# Patient Record
Sex: Female | Born: 1960
Health system: Southern US, Community
[De-identification: ages and names within clinical notes are randomized; demographics above are authoritative.]

## PROBLEM LIST (undated history)

## (undated) DIAGNOSIS — I82409 Acute embolism and thrombosis of unspecified deep veins of unspecified lower extremity: Secondary | ICD-10-CM

## (undated) DIAGNOSIS — Z7901 Long term (current) use of anticoagulants: Secondary | ICD-10-CM

## (undated) DIAGNOSIS — E119 Type 2 diabetes mellitus without complications: Secondary | ICD-10-CM

## (undated) DIAGNOSIS — I10 Essential (primary) hypertension: Secondary | ICD-10-CM

## (undated) DIAGNOSIS — Z9229 Personal history of other drug therapy: Secondary | ICD-10-CM

## (undated) DIAGNOSIS — E669 Obesity, unspecified: Secondary | ICD-10-CM

## (undated) HISTORY — DX: Type 2 diabetes mellitus without complications: E11.9

## (undated) HISTORY — PX: TONSILLECTOMY: SHX5217

---

## 2006-05-15 ENCOUNTER — Emergency Department (HOSPITAL_COMMUNITY): Admission: EM | Admit: 2006-05-15 | Discharge: 2006-05-15 | Payer: Self-pay | Admitting: Emergency Medicine

## 2006-07-08 ENCOUNTER — Other Ambulatory Visit: Admission: RE | Admit: 2006-07-08 | Discharge: 2006-07-08 | Payer: Self-pay | Admitting: Obstetrics and Gynecology

## 2008-10-06 HISTORY — PX: ABDOMINAL HYSTERECTOMY: SHX81

## 2008-10-06 HISTORY — PX: TOTAL ABDOMINAL HYSTERECTOMY: SHX209

## 2009-05-26 ENCOUNTER — Inpatient Hospital Stay (HOSPITAL_COMMUNITY): Admission: EM | Admit: 2009-05-26 | Discharge: 2009-05-30 | Payer: Self-pay | Admitting: Emergency Medicine

## 2009-05-28 ENCOUNTER — Encounter (INDEPENDENT_AMBULATORY_CARE_PROVIDER_SITE_OTHER): Payer: Self-pay | Admitting: Internal Medicine

## 2009-05-28 ENCOUNTER — Ambulatory Visit: Payer: Self-pay | Admitting: Vascular Surgery

## 2009-12-27 ENCOUNTER — Encounter: Admission: RE | Admit: 2009-12-27 | Discharge: 2009-12-27 | Payer: Self-pay | Admitting: Obstetrics and Gynecology

## 2010-01-02 ENCOUNTER — Encounter: Admission: RE | Admit: 2010-01-02 | Discharge: 2010-01-02 | Payer: Self-pay | Admitting: Obstetrics and Gynecology

## 2010-01-04 ENCOUNTER — Encounter: Admission: RE | Admit: 2010-01-04 | Discharge: 2010-01-04 | Payer: Self-pay | Admitting: Obstetrics and Gynecology

## 2010-02-26 ENCOUNTER — Encounter (INDEPENDENT_AMBULATORY_CARE_PROVIDER_SITE_OTHER): Payer: Self-pay | Admitting: Obstetrics and Gynecology

## 2010-02-26 ENCOUNTER — Inpatient Hospital Stay (HOSPITAL_COMMUNITY): Admission: RE | Admit: 2010-02-26 | Discharge: 2010-02-28 | Payer: Self-pay | Admitting: Obstetrics and Gynecology

## 2010-12-23 LAB — CBC
MCHC: 32.6 g/dL (ref 30.0–36.0)
MCHC: 32.9 g/dL (ref 30.0–36.0)
MCV: 74.6 fL — ABNORMAL LOW (ref 78.0–100.0)
MCV: 75.3 fL — ABNORMAL LOW (ref 78.0–100.0)
RBC: 4.58 MIL/uL (ref 3.87–5.11)
RDW: 18.2 % — ABNORMAL HIGH (ref 11.5–15.5)
WBC: 10.9 10*3/uL — ABNORMAL HIGH (ref 4.0–10.5)

## 2010-12-23 LAB — COMPREHENSIVE METABOLIC PANEL
AST: 19 U/L (ref 0–37)
Calcium: 9.2 mg/dL (ref 8.4–10.5)
Chloride: 106 mEq/L (ref 96–112)
GFR calc Af Amer: >60 mL/min (ref >60)
GFR calc non Af Amer: >60 mL/min (ref >60)
Sodium: 139 mEq/L (ref 135–145)
Total Protein: 7.5 g/dL (ref 6.0–8.3)

## 2010-12-25 ENCOUNTER — Emergency Department (HOSPITAL_COMMUNITY)
Admission: EM | Admit: 2010-12-25 | Discharge: 2010-12-26 | Disposition: A | Discharge status: Home / Self Care | Payer: Medicare Other | Attending: Emergency Medicine | Admitting: Emergency Medicine

## 2010-12-25 DIAGNOSIS — M545 Low back pain, unspecified: Secondary | ICD-10-CM | POA: Insufficient documentation

## 2010-12-25 DIAGNOSIS — I10 Essential (primary) hypertension: Secondary | ICD-10-CM | POA: Insufficient documentation

## 2010-12-25 DIAGNOSIS — Z86718 Personal history of other venous thrombosis and embolism: Secondary | ICD-10-CM | POA: Insufficient documentation

## 2010-12-26 LAB — URINALYSIS, ROUTINE W REFLEX MICROSCOPIC
Hgb urine dipstick: NEGATIVE
Nitrite: NEGATIVE
Protein, ur: NEGATIVE mg/dL
Specific Gravity, Urine: 1.021 (ref 1.005–1.030)
Urobilinogen, UA: 1 mg/dL (ref 0.0–1.0)

## 2011-01-11 LAB — DIFFERENTIAL
Eosinophils Relative: 5 % (ref 0–5)
Lymphocytes Relative: 18 % (ref 12–46)
Lymphocytes Relative: 22 % (ref 12–46)
Lymphs Abs: 1.7 10*3/uL (ref 0.7–4.0)
Monocytes Absolute: 0.3 10*3/uL (ref 0.1–1.0)
Monocytes Relative: 3 % (ref 3–12)
Neutro Abs: 5.3 10*3/uL (ref 1.7–7.7)
Neutro Abs: 7.1 10*3/uL (ref 1.7–7.7)
Neutrophils Relative %: 66 % (ref 43–77)
Neutrophils Relative %: 73 % (ref 43–77)

## 2011-01-11 LAB — CBC
HCT: 28.5 % — ABNORMAL LOW (ref 36.0–46.0)
HCT: 28.8 % — ABNORMAL LOW (ref 36.0–46.0)
MCHC: 31 g/dL (ref 30.0–36.0)
MCV: 64.6 fL — ABNORMAL LOW (ref 78.0–100.0)
MCV: 64.8 fL — ABNORMAL LOW (ref 78.0–100.0)
MCV: 64.8 fL — ABNORMAL LOW (ref 78.0–100.0)
Platelets: 285 10*3/uL (ref 150–400)
Platelets: 337 10*3/uL (ref 150–400)
Platelets: 341 10*3/uL (ref 150–400)
RBC: 4.59 MIL/uL (ref 3.87–5.11)
RDW: 21.1 % — ABNORMAL HIGH (ref 11.5–15.5)
WBC: 8 10*3/uL (ref 4.0–10.5)
WBC: 8.3 10*3/uL (ref 4.0–10.5)
WBC: 9.7 10*3/uL (ref 4.0–10.5)

## 2011-01-11 LAB — HEMOGLOBIN A1C
Hgb A1c MFr Bld: 6 % (ref 4.6–6.1)
Mean Plasma Glucose: 126 mg/dL

## 2011-01-11 LAB — PROTEIN C, TOTAL: Protein C, Total: 44 % — ABNORMAL LOW (ref 70–140)

## 2011-01-11 LAB — POCT I-STAT, CHEM 8
Chloride: 109 mEq/L (ref 96–112)
Potassium: 3.3 mEq/L — ABNORMAL LOW (ref 3.5–5.1)
TCO2: 20 mmol/L (ref 0–100)

## 2011-01-11 LAB — LIPID PANEL
LDL Cholesterol: 127 mg/dL — ABNORMAL HIGH (ref 0–99)
VLDL: 15 mg/dL (ref 0–40)

## 2011-01-11 LAB — PROTIME-INR
INR: 1.2 (ref 0.00–1.49)
INR: 1.2 (ref 0.00–1.49)
Prothrombin Time: 14.9 seconds (ref 11.6–15.2)
Prothrombin Time: 15.2 seconds (ref 11.6–15.2)
Prothrombin Time: 15.5 seconds — ABNORMAL HIGH (ref 11.6–15.2)

## 2011-01-11 LAB — CARDIOLIPIN ANTIBODIES, IGG, IGM, IGA: Anticardiolipin IgA: <10 not reported — ABNORMAL LOW (ref >13)

## 2011-01-11 LAB — FACTOR 5 LEIDEN

## 2011-01-11 LAB — HEMOGLOBIN AND HEMATOCRIT, BLOOD
Hemoglobin: 8.9 g/dL — ABNORMAL LOW (ref 12.0–15.0)
Hemoglobin: 8.9 g/dL — ABNORMAL LOW (ref 12.0–15.0)

## 2011-01-11 LAB — HOMOCYSTEINE
Homocysteine: 11.3 umol/L (ref 4.0–15.4)
Homocysteine: 8.7 umol/L (ref 4.0–15.4)

## 2011-01-11 LAB — PROTEIN C ACTIVITY: Protein C Activity: 61 % — ABNORMAL LOW (ref 75–133)

## 2011-01-11 LAB — HEPATIC FUNCTION PANEL
Alkaline Phosphatase: 74 U/L (ref 39–117)
Total Bilirubin: 0.5 mg/dL (ref 0.3–1.2)

## 2011-01-11 LAB — IRON AND TIBC
Iron: 19 ug/dL — ABNORMAL LOW (ref 42–135)
Saturation Ratios: 5 % — ABNORMAL LOW (ref 20–55)
UIBC: 335 ug/dL

## 2011-01-11 LAB — POCT CARDIAC MARKERS
CKMB, poc: <1 ng/mL — ABNORMAL LOW (ref 1.0–8.0)
Myoglobin, poc: 78.4 ng/mL (ref 12–200)
Myoglobin, poc: 80.2 ng/mL (ref 12–200)

## 2011-01-11 LAB — VITAMIN B12: Vitamin B-12: 636 pg/mL (ref 211–911)

## 2011-01-11 LAB — FERRITIN: Ferritin: 12 ng/mL (ref 10–291)

## 2011-01-11 LAB — BASIC METABOLIC PANEL
BUN: 7 mg/dL (ref 6–23)
Chloride: 107 mEq/L (ref 96–112)
Creatinine, Ser: 0.88 mg/dL (ref 0.4–1.2)

## 2011-01-11 LAB — PROTEIN S ACTIVITY: Protein S Activity: 34 % — ABNORMAL LOW (ref 69–129)

## 2011-01-11 LAB — BRAIN NATRIURETIC PEPTIDE: Pro B Natriuretic peptide (BNP): 41 pg/mL (ref 0.0–100.0)

## 2011-01-11 LAB — LUPUS ANTICOAGULANT PANEL: Lupus Anticoagulant: DETECTED — AB

## 2011-01-11 LAB — PROTEIN S, TOTAL: Protein S Ag, Total: 78 % (ref 70–140)

## 2011-01-11 LAB — TSH: TSH: 2.173 u[IU]/mL (ref 0.350–4.500)

## 2011-01-20 ENCOUNTER — Other Ambulatory Visit: Payer: Self-pay | Admitting: Obstetrics and Gynecology

## 2011-01-20 DIAGNOSIS — Z1231 Encounter for screening mammogram for malignant neoplasm of breast: Secondary | ICD-10-CM

## 2011-01-24 ENCOUNTER — Ambulatory Visit
Admission: RE | Admit: 2011-01-24 | Discharge: 2011-01-24 | Disposition: A | Discharge status: Home / Self Care | Payer: Medicare Other | Source: Ambulatory Visit | Attending: Obstetrics and Gynecology | Admitting: Obstetrics and Gynecology

## 2011-01-24 DIAGNOSIS — Z1231 Encounter for screening mammogram for malignant neoplasm of breast: Secondary | ICD-10-CM

## 2011-02-18 NOTE — Discharge Summary (Signed)
[name], [name] NO.:  [id number]   MEDICAL RECORD NO.:  [id number]          PATIENT TYPE:  INP   LOCATION:  5017                         FACILITY:  [facility]   PHYSICIAN:  Laura Campbell, MDDATE OF BIRTH:  [birth year]   DATE OF ADMISSION:  05/26/2009  DATE OF DISCHARGE:  05/30/2009                               DISCHARGE SUMMARY   COURSE IN THE HOSPITAL:  A 50 year old female with history of  hypertension, noncompliant with medication presented with complains of  left lower extremity pain and shortness of breath.  On admission,  patient had a CT angio of the chest which showed bilateral pulmonary  embolism.  Subsequent Dopplers done of the lower extremity showed acute  DVT.  Patient was started on Lovenox and Coumadin.  Hypercoagulable  studies showed patient is positive for lupus anticoagulant.  At this  time, we feel patient needs to be on Coumadin lifelong.  Patient's  condition gradually improved.  At this time, patient is ambulating  without any difficulty.  Patient's INR is 2.2, will need at least 2 more  days of Lovenox after Coumadin to be continued.   Patient also had a sonogram of the pelvis which showed right adnexal  complex cyst for which patient already has a known OB/GYN, Dr.  Senaida Ores.  Patient is well aware to follow with Dr. Senaida Ores for  this complex cyst which patient strongly agrees to so patient has a  contact number of [phone].  I also called Dr. Berenda Morale office  who will be calling [name] for followup appointment.  At the time  of this dictation, patient is hemodynamically stable.   PERTINENT LABS:  Patient's lupus anticoagulant is positive.  Patient's  hemoglobin is 8.9 with a hematocrit of 28.5 which is largely stable.  Patient's INR at discharge was 2.2, creatinine was 0.8, last measured.  Percent iron saturation of 5%.   RADIOLOGICAL PROCEDURES DONE:  1. CT angio of the chest on May 26, 2009, showed  segmental and      subsegmental pulmonary emboli identified to the right middle lobe,      right lower lobe, left upper lobe and lower lobe, possible      subsegmental emboli to the right upper lobe, mild left basilar      atelectasis, mild cardiomegaly, prominent right node, mildly      prominent hilar nodes seen.  2. Ultrasound, pelvis, on May 26, 2009, showed a complex 7-cm      cystic adnexal mass.  Followup recommended to exclude neoplasm,      uterine leiomyomata.  3. Transvaginal ultrasound on May 26, 2009.  4. Ankle x-ray of the left on May 28, 2009, showed no acute      findings, mild degenerative changes.   MEDICATIONS AT DISCHARGE:  1. Lovenox 110 mg subcutaneous every 12 hours, 7 a.m. and 7 p.m.  2. Coumadin 10 mg p.o. daily, to recheck PT/INR on June 01, 2009.      At that time, if still therapeutic can discontinue Lovenox.  3. Hydrochlorothiazide 25 mg p.o. daily.  4. Lisinopril 40 mg p.o. daily.  5. Ferrous sulfate 325 mg p.o. t.i.d.   PLAN:  1. Patient is to follow with Dr. Lovell Sheehan.  Patient has been provided      the phone number, [phone], on June 01, 2009, at 12:30 p.m.      and at that time PT/INR has to be checked and further dose of      Coumadin decided at that time.  If still therapeutic, Lovenox will      be discontinued.  2. Patient has iron-deficiency anemia.  Patient was advised to check a      CBC while following with Dr. Lovell Sheehan.  Iron supplements have been      started.  3. To follow with Dr. Senaida Ores, OB/GYN, for her right adnexal mass.      Patient agrees to do so.  4. To have a 2D echo in 6 months to reassess pulmonary pressure and to      have a repeat CT chest in 3 months to assess for lymphadenopathy.   DIET:  Patient is to be on a cardiac healthy diet.  Patient probably  needs Coumadin for life long as patient was positive lupus  anticoagulant.      Laura Clos, MD  Electronically Signed     ANK/MEDQ  D:   05/30/2009  T:  05/30/2009  Job:  [id number]   cc:   Della Goo, M.D.  Dr. Senaida Ores
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
## 2011-03-05 ENCOUNTER — Emergency Department (HOSPITAL_COMMUNITY)
Admission: EM | Admit: 2011-03-05 | Discharge: 2011-03-05 | Disposition: A | Discharge status: Home / Self Care | Payer: Medicare Other | Attending: Emergency Medicine | Admitting: Emergency Medicine

## 2011-03-05 DIAGNOSIS — M533 Sacrococcygeal disorders, not elsewhere classified: Secondary | ICD-10-CM | POA: Insufficient documentation

## 2011-03-05 DIAGNOSIS — Z86718 Personal history of other venous thrombosis and embolism: Secondary | ICD-10-CM | POA: Insufficient documentation

## 2011-03-05 DIAGNOSIS — I10 Essential (primary) hypertension: Secondary | ICD-10-CM | POA: Insufficient documentation

## 2011-03-05 DIAGNOSIS — M25559 Pain in unspecified hip: Secondary | ICD-10-CM | POA: Insufficient documentation

## 2011-03-05 DIAGNOSIS — M5430 Sciatica, unspecified side: Secondary | ICD-10-CM | POA: Insufficient documentation

## 2011-03-05 DIAGNOSIS — R109 Unspecified abdominal pain: Secondary | ICD-10-CM | POA: Insufficient documentation

## 2011-03-05 LAB — URINALYSIS, ROUTINE W REFLEX MICROSCOPIC
Glucose, UA: NEGATIVE mg/dL
Hgb urine dipstick: NEGATIVE
Ketones, ur: NEGATIVE mg/dL
Protein, ur: NEGATIVE mg/dL
pH: 5.5 (ref 5.0–8.0)

## 2011-08-07 ENCOUNTER — Emergency Department (HOSPITAL_COMMUNITY)
Admission: EM | Admit: 2011-08-07 | Discharge: 2011-08-07 | Disposition: A | Discharge status: Home / Self Care | Payer: Medicare Other | Attending: Emergency Medicine | Admitting: Emergency Medicine

## 2011-08-07 ENCOUNTER — Emergency Department (HOSPITAL_COMMUNITY): Payer: Medicare Other

## 2011-08-07 DIAGNOSIS — R05 Cough: Secondary | ICD-10-CM | POA: Insufficient documentation

## 2011-08-07 DIAGNOSIS — R059 Cough, unspecified: Secondary | ICD-10-CM | POA: Insufficient documentation

## 2011-08-07 DIAGNOSIS — I10 Essential (primary) hypertension: Secondary | ICD-10-CM | POA: Insufficient documentation

## 2011-08-07 DIAGNOSIS — J40 Bronchitis, not specified as acute or chronic: Secondary | ICD-10-CM | POA: Insufficient documentation

## 2011-08-07 DIAGNOSIS — R6883 Chills (without fever): Secondary | ICD-10-CM | POA: Insufficient documentation

## 2011-08-07 DIAGNOSIS — Z86718 Personal history of other venous thrombosis and embolism: Secondary | ICD-10-CM | POA: Insufficient documentation

## 2011-08-07 DIAGNOSIS — E669 Obesity, unspecified: Secondary | ICD-10-CM | POA: Insufficient documentation

## 2011-08-07 DIAGNOSIS — R0789 Other chest pain: Secondary | ICD-10-CM | POA: Insufficient documentation

## 2012-09-11 ENCOUNTER — Encounter (HOSPITAL_COMMUNITY): Payer: Self-pay | Admitting: *Deleted

## 2012-09-11 ENCOUNTER — Emergency Department (HOSPITAL_COMMUNITY)
Admission: EM | Admit: 2012-09-11 | Discharge: 2012-09-11 | Disposition: A | Discharge status: Home / Self Care | Payer: Medicare Other | Attending: Emergency Medicine | Admitting: Emergency Medicine

## 2012-09-11 DIAGNOSIS — M549 Dorsalgia, unspecified: Secondary | ICD-10-CM | POA: Insufficient documentation

## 2012-09-11 DIAGNOSIS — G8929 Other chronic pain: Secondary | ICD-10-CM

## 2012-09-11 MED ORDER — PREDNISONE 20 MG PO TABS
60.0000 mg | ORAL_TABLET | Freq: Once | ORAL | Status: AC
Start: 2012-09-11 — End: 2012-09-11
  Administered 2012-09-11: 60 mg via ORAL
  Filled 2012-09-11: qty 3

## 2012-09-11 MED ORDER — PREDNISONE 20 MG PO TABS: ORAL_TABLET | ORAL | Status: DC

## 2012-09-11 MED ORDER — CYCLOBENZAPRINE HCL 5 MG PO TABS: 5.0000 mg | ORAL_TABLET | Freq: Three times a day (TID) | ORAL | Status: DC | PRN

## 2012-09-11 NOTE — ED Notes (Signed)
Patient is alert and orientedx4.   Patient was explained discharge instructions and she has no questions.
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
## 2012-09-11 NOTE — ED Notes (Signed)
The pt has had lower back pain for 2 weeks this time.  She has chronic back pain  For a long time.  No new injury.  She has pain from her neck down to her lower back

## 2012-09-11 NOTE — ED Provider Notes (Signed)
History     CSN: [id number]  Arrival date & time 09/11/12  4696   First MD Initiated Contact with Patient 09/11/12 2103      Chief Complaint  Patient presents with  . Back Pain    (Consider location/radiation/quality/duration/timing/severity/associated sxs/prior treatment) HPI Comments: Patient with Hx chronic back pain refusing surgery has been taking Percocet for 2 weeks without relief nor FU with PCP now pain is radiation to R buttock   Patient is a 51 y.o. female presenting with back pain. The history is provided by the patient.  Back Pain  This is a chronic problem. The current episode started more than 1 week ago. The problem occurs constantly. The problem has been gradually worsening. Pertinent negatives include no fever and no weakness.    History reviewed. No pertinent past medical history.  History reviewed. No pertinent past surgical history.  History reviewed. No pertinent family history.  History  Substance Use Topics  . Smoking status: Never Smoker   . Smokeless tobacco: Not on file  . Alcohol Use: No    OB History    Grav Para Term Preterm Abortions TAB SAB Ect Mult Living                  Review of Systems  Constitutional: Negative for fever and chills.  HENT: Negative for neck pain.   Respiratory: Negative for cough.   Cardiovascular: Negative for leg swelling.  Genitourinary: Negative.   Musculoskeletal: Positive for back pain. Negative for joint swelling and gait problem.  Neurological: Negative for dizziness and weakness.    Allergies  Review of patient's allergies indicates no known allergies.  Home Medications   Current Outpatient Rx  Name  Route  Sig  Dispense  Refill  . HYDROCHLOROTHIAZIDE 25 MG PO TABS   Oral   Take 25 mg by mouth daily.         Marland Kitchen HYDROCODONE-ACETAMINOPHEN 5-500 MG PO TABS   Oral   Take 1 tablet by mouth 2 (two) times daily as needed. For pain         . LOSARTAN POTASSIUM 100 MG PO TABS   Oral   Take 100  mg by mouth daily.         Marland Kitchen POTASSIUM CHLORIDE CRYS ER 10 MEQ PO TBCR   Oral   Take 10 mEq by mouth daily.         . CYCLOBENZAPRINE HCL 5 MG PO TABS   Oral   Take 1 tablet (5 mg total) by mouth 3 (three) times daily as needed for muscle spasms.   30 tablet   0   . PREDNISONE 20 MG PO TABS      3 Tabs PO Days 1-3, then 2 tabs PO Days 4-6, then 1 tab PO Day 7-9, then Half Tab PO Day 10-12   20 tablet   0     BP 144/76  Pulse 58  Temp 97.8 F (36.6 C) (Oral)  Resp 20  SpO2 96%  Physical Exam  Constitutional: She is oriented to person, place, and time. She appears well-developed and well-nourished.  HENT:  Head: Normocephalic.  Eyes: Pupils are equal, round, and reactive to light.  Neck: Normal range of motion.  Cardiovascular: Normal rate.   Pulmonary/Chest: Effort normal.  Abdominal: Soft.  Musculoskeletal: Normal range of motion.  Neurological: She is alert and oriented to person, place, and time.  Skin: Skin is warm.    ED Course  Procedures (including critical care time)  Labs Reviewed - No data to display No results found.   1. Chronic back pain       MDM  Chronic back pain excerebration.           Arman Filter, NP 09/11/12 2154

## 2012-09-12 NOTE — ED Provider Notes (Signed)
Medical screening examination/treatment/procedure(s) were performed by non-physician practitioner and as supervising physician I was immediately available for consultation/collaboration.  Nihal Marzella, MD 09/12/12 1731 

## 2012-12-31 ENCOUNTER — Encounter (HOSPITAL_COMMUNITY): Payer: Self-pay

## 2012-12-31 ENCOUNTER — Emergency Department (HOSPITAL_COMMUNITY)
Admission: EM | Admit: 2012-12-31 | Discharge: 2012-12-31 | Disposition: A | Discharge status: Home / Self Care | Payer: Medicare Other | Attending: Emergency Medicine | Admitting: Emergency Medicine

## 2012-12-31 DIAGNOSIS — G8929 Other chronic pain: Secondary | ICD-10-CM

## 2012-12-31 DIAGNOSIS — M542 Cervicalgia: Secondary | ICD-10-CM | POA: Insufficient documentation

## 2012-12-31 DIAGNOSIS — R269 Unspecified abnormalities of gait and mobility: Secondary | ICD-10-CM | POA: Insufficient documentation

## 2012-12-31 DIAGNOSIS — Z8679 Personal history of other diseases of the circulatory system: Secondary | ICD-10-CM | POA: Insufficient documentation

## 2012-12-31 DIAGNOSIS — M545 Low back pain, unspecified: Secondary | ICD-10-CM

## 2012-12-31 DIAGNOSIS — Z79899 Other long term (current) drug therapy: Secondary | ICD-10-CM | POA: Insufficient documentation

## 2012-12-31 DIAGNOSIS — M5430 Sciatica, unspecified side: Secondary | ICD-10-CM

## 2012-12-31 DIAGNOSIS — M7989 Other specified soft tissue disorders: Secondary | ICD-10-CM | POA: Insufficient documentation

## 2012-12-31 DIAGNOSIS — I10 Essential (primary) hypertension: Secondary | ICD-10-CM | POA: Insufficient documentation

## 2012-12-31 HISTORY — DX: Essential (primary) hypertension: I10

## 2012-12-31 LAB — URINALYSIS, ROUTINE W REFLEX MICROSCOPIC
Bilirubin Urine: NEGATIVE
Glucose, UA: NEGATIVE mg/dL
Ketones, ur: NEGATIVE mg/dL
Leukocytes, UA: NEGATIVE
Specific Gravity, Urine: 1.022 (ref 1.005–1.030)
pH: 5 (ref 5.0–8.0)

## 2012-12-31 LAB — POCT I-STAT, CHEM 8
Chloride: 106 mEq/L (ref 96–112)
Glucose, Bld: 117 mg/dL — ABNORMAL HIGH (ref 70–99)
HCT: 41 % (ref 36.0–46.0)
Potassium: 3.9 mEq/L (ref 3.5–5.1)
Sodium: 142 mEq/L (ref 135–145)

## 2012-12-31 MED ORDER — HYDROCODONE-ACETAMINOPHEN 5-325 MG PO TABS: 1.0000 | ORAL_TABLET | Freq: Four times a day (QID) | ORAL | Status: DC | PRN

## 2012-12-31 MED ORDER — MELOXICAM 15 MG PO TABS: 15.0000 mg | ORAL_TABLET | Freq: Every day | ORAL | Status: DC

## 2012-12-31 MED ORDER — DEXAMETHASONE SODIUM PHOSPHATE 10 MG/ML IJ SOLN
10.0000 mg | Freq: Once | INTRAMUSCULAR | Status: AC
  Administered 2012-12-31: 10 mg via INTRAMUSCULAR
  Filled 2012-12-31: qty 1

## 2012-12-31 NOTE — ED Notes (Signed)
Pt dc'd home w/2 rx, drove self home, no narcotics given in ED, pt alert and ambulatory upon dc, verbalizes understanding of dc instructions

## 2012-12-31 NOTE — ED Provider Notes (Signed)
History     CSN: [id number]  Arrival date & time 12/31/12  0811   First MD Initiated Contact with Patient 12/31/12 [phone]      Chief Complaint  Patient presents with  . Sciatica    (Consider location/radiation/quality/duration/timing/severity/associated sxs/prior treatment) The history is provided by the patient. No language interpreter was used.     Laura Campbell is a 52 year old female with a past medical history of hypertension, cardiac disease, intermittent back pain with sciatica, previous DVT of the left leg.  Patient states that she was here 2 months ago for this same issue.  She was given oral prednisone which did not relieve her symptoms.  Patient states that she normally gets IM dexamethasone which generally helps her.  Patient states that her pain has been increasing in severity over the past 4 days and includes bilateral sciatica which is new. She normally has UL sciatica.  She has pain in her hips and generalized back pain all the way up to the neck.  He should also complains of swelling and tightness in the left leg and pain with walking.  She has a previous history of DVT in the leg but states her symptoms are different from her previous DVT. Denies weakness, loss of bowel/bladder function or saddle anesthesia. Denies neck stiffness, headache, rash.  Denies fever or recent procedures to back. Patient is able to ambulate with pain.  He has been taking Advil for pain relief which had little effect.      Past Medical History  Diagnosis Date  . Hypertension     History reviewed. No pertinent past surgical history.  History reviewed. No pertinent family history.  History  Substance Use Topics  . Smoking status: Never Smoker   . Smokeless tobacco: Not on file  . Alcohol Use: No    OB History   Grav Para Term Preterm Abortions TAB SAB Ect Mult Living                  Review of Systems  Constitutional: Positive for activity change. Negative for fever, chills  and unexpected weight change.  HENT: Positive for neck pain. Negative for trouble swallowing and neck stiffness.   Eyes: Negative.   Respiratory: Negative for chest tightness and shortness of breath.   Cardiovascular: Positive for leg swelling. Negative for chest pain and palpitations.  Gastrointestinal: Negative for nausea, vomiting, abdominal pain, diarrhea and constipation.  Genitourinary: Negative for dysuria, urgency, hematuria, flank pain, difficulty urinating and pelvic pain.  Musculoskeletal: Positive for back pain and gait problem. Negative for myalgias and joint swelling.  Skin: Negative for rash and wound.  Neurological: Negative for dizziness, tremors, syncope, facial asymmetry, weakness, light-headedness and numbness.  All other systems reviewed and are negative.    Allergies  Review of patient's allergies indicates no known allergies.  Home Medications   Current Outpatient Rx  Name  Route  Sig  Dispense  Refill  . hydrochlorothiazide (HYDRODIURIL) 25 MG tablet   Oral   Take 25 mg by mouth daily.         Marland Kitchen losartan (COZAAR) 100 MG tablet   Oral   Take 100 mg by mouth daily.         . potassium chloride (K-DUR,KLOR-CON) 10 MEQ tablet   Oral   Take 10 mEq by mouth daily.           BP 181/99  Pulse 64  Temp(Src) 97.7 F (36.5 C) (Oral)  Resp 20  SpO2 99%  Physical Exam  Constitutional: She is oriented to person, place, and time. She appears well-developed and well-nourished. No distress.  HENT:  Head: Normocephalic and atraumatic.  Eyes: Conjunctivae are normal. No scleral icterus.  Neck: Normal range of motion.  Cardiovascular: Normal rate, regular rhythm, normal heart sounds and intact distal pulses.  Exam reveals no gallop and no friction rub.   No murmur heard. Patient with swelling of the left calf greater than the right by approximately 3 cm.  Pulmonary/Chest: Effort normal and breath sounds normal. No respiratory distress.  Abdominal: Soft.  Bowel sounds are normal. She exhibits no distension and no mass. There is no tenderness. There is no guarding.  Musculoskeletal: She exhibits tenderness. She exhibits no edema.  Back exam: antalgic gait, limited range of motion, tenderness noted diffusely, sacroiliac joints and sciatic notches tender, positive straight-leg raise pos, normal reflexes and strength bilateral lower extremities.   Neurological: She is alert and oriented to person, place, and time.  Skin: Skin is warm and dry. She is not diaphoretic.    ED Course  Procedures (including critical care time)  Labs Reviewed  URINALYSIS, ROUTINE W REFLEX MICROSCOPIC - Abnormal; Notable for the following:    APPearance HAZY (*)    All other components within normal limits  POCT I-STAT, CHEM 8 - Abnormal; Notable for the following:    Glucose, Bld 117 (*)    Calcium, Ion 1.29 (*)    All other components within normal limits  D-DIMER, QUANTITATIVE   No results found.   1. Acute exacerbation of chronic low back pain   2. Sciatica, unspecified laterality       MDM  Patient seen for bask pain with swelling of the left calf.  Will evaluate for DVT with D-Dimer.    D-dimer negative. Will d/c with NSAIDs. Narcotic, PCP follow up. Patient with back pain.  No neurological deficits and normal neuro exam.  Patient can walk but states is painful.  No loss of bowel or bladder control.  No concern for cauda equina.  No fever, night sweats, weight loss, h/o cancer, IVDU.  RICE protocol and pain medicine indicated and discussed with patient.          Arthor Captain, PA-C 01/02/13 1330

## 2012-12-31 NOTE — ED Notes (Signed)
Pt complains of pain in right flank, back and hip area, onset was 4 days ago, sts her pain meds are just not helping.

## 2013-01-03 NOTE — ED Provider Notes (Signed)
Medical screening examination/treatment/procedure(s) were performed by non-physician practitioner and as supervising physician I was immediately available for consultation/collaboration.   Shelda Jakes, MD 01/03/13 [phone]

## 2013-02-23 ENCOUNTER — Other Ambulatory Visit: Payer: Self-pay | Admitting: Internal Medicine

## 2013-02-23 DIAGNOSIS — Z1231 Encounter for screening mammogram for malignant neoplasm of breast: Secondary | ICD-10-CM

## 2013-04-06 ENCOUNTER — Ambulatory Visit
Admission: RE | Admit: 2013-04-06 | Discharge: 2013-04-06 | Disposition: A | Discharge status: Home / Self Care | Payer: Medicare Other | Source: Ambulatory Visit | Attending: Internal Medicine | Admitting: Internal Medicine

## 2013-04-06 DIAGNOSIS — Z1231 Encounter for screening mammogram for malignant neoplasm of breast: Secondary | ICD-10-CM

## 2013-05-08 ENCOUNTER — Emergency Department (HOSPITAL_COMMUNITY)
Admission: EM | Admit: 2013-05-08 | Discharge: 2013-05-08 | Disposition: A | Discharge status: Home / Self Care | Payer: Medicare Other | Attending: Emergency Medicine | Admitting: Emergency Medicine

## 2013-05-08 ENCOUNTER — Encounter (HOSPITAL_COMMUNITY): Payer: Self-pay | Admitting: *Deleted

## 2013-05-08 DIAGNOSIS — R000 Tachycardia, unspecified: Secondary | ICD-10-CM | POA: Insufficient documentation

## 2013-05-08 DIAGNOSIS — J029 Acute pharyngitis, unspecified: Secondary | ICD-10-CM | POA: Insufficient documentation

## 2013-05-08 DIAGNOSIS — R509 Fever, unspecified: Secondary | ICD-10-CM | POA: Insufficient documentation

## 2013-05-08 DIAGNOSIS — B349 Viral infection, unspecified: Secondary | ICD-10-CM

## 2013-05-08 DIAGNOSIS — Z79899 Other long term (current) drug therapy: Secondary | ICD-10-CM | POA: Insufficient documentation

## 2013-05-08 DIAGNOSIS — IMO0001 Reserved for inherently not codable concepts without codable children: Secondary | ICD-10-CM | POA: Insufficient documentation

## 2013-05-08 DIAGNOSIS — B9789 Other viral agents as the cause of diseases classified elsewhere: Secondary | ICD-10-CM | POA: Insufficient documentation

## 2013-05-08 DIAGNOSIS — I10 Essential (primary) hypertension: Secondary | ICD-10-CM | POA: Insufficient documentation

## 2013-05-08 LAB — POCT I-STAT, CHEM 8
BUN: 12 mg/dL (ref 6–23)
Calcium, Ion: 1.19 mmol/L (ref 1.12–1.23)
Chloride: 106 mEq/L (ref 96–112)
Glucose, Bld: 110 mg/dL — ABNORMAL HIGH (ref 70–99)
HCT: 41 % (ref 36.0–46.0)

## 2013-05-08 LAB — CBC
HCT: 40.3 % (ref 36.0–46.0)
Hemoglobin: 13.4 g/dL (ref 12.0–15.0)
MCHC: 33.3 g/dL (ref 30.0–36.0)
RBC: 5.22 MIL/uL — ABNORMAL HIGH (ref 3.87–5.11)
WBC: 11.1 10*3/uL — ABNORMAL HIGH (ref 4.0–10.5)

## 2013-05-08 MED ORDER — SODIUM CHLORIDE 0.9 % IV BOLUS (SEPSIS)
1000.0000 mL | Freq: Once | INTRAVENOUS | Status: AC
  Administered 2013-05-08: 1000 mL via INTRAVENOUS

## 2013-05-08 MED ORDER — ACETAMINOPHEN 325 MG PO TABS
650.0000 mg | ORAL_TABLET | Freq: Once | ORAL | Status: AC
  Administered 2013-05-08: 650 mg via ORAL
  Filled 2013-05-08: qty 2

## 2013-05-08 NOTE — ED Notes (Signed)
Self administered ibuprofen 1800

## 2013-05-08 NOTE — ED Notes (Signed)
Family at bedside. 

## 2013-05-08 NOTE — ED Notes (Signed)
MD at bedside. 

## 2013-05-08 NOTE — ED Provider Notes (Addendum)
CSN: [id number]     Arrival date & time 05/08/13  1850 History     First MD Initiated Contact with Patient 05/08/13 1917     Chief Complaint  Patient presents with  . Shortness of Breath   (Consider location/radiation/quality/duration/timing/severity/associated sxs/prior Treatment) Patient is a 52 y.o. female presenting with fever. The history is provided by the patient and a relative.  Fever Max temp prior to arrival:  103 Temp source:  Oral Severity:  Severe Onset quality:  Gradual Duration:  1 day Timing:  Constant Progression:  Worsening Chronicity:  New Relieved by:  Nothing Worsened by:  Nothing tried Ineffective treatments:  Ibuprofen Associated symptoms: chills, myalgias and sore throat   Associated symptoms: no chest pain, no confusion, no cough, no dysuria, no ear pain, no headaches, no nausea and no vomiting   Associated symptoms comment:  Generalized malaise and weakness Risk factors: sick contacts   Risk factors: no hx of cancer, no immunosuppression and no recent surgery   Risk factors comment:  Recently around her grandchildren who were sick with URI sx.   Past Medical History  Diagnosis Date  . Hypertension    No past surgical history on file. No family history on file. History  Substance Use Topics  . Smoking status: Never Smoker   . Smokeless tobacco: Not on file  . Alcohol Use: No   OB History   Grav Para Term Preterm Abortions TAB SAB Ect Mult Living                 Review of Systems  Constitutional: Positive for fever and chills.  HENT: Positive for sore throat. Negative for ear pain.   Respiratory: Negative for cough.   Cardiovascular: Negative for chest pain.  Gastrointestinal: Negative for nausea and vomiting.  Genitourinary: Negative for dysuria.  Musculoskeletal: Positive for myalgias.  Neurological: Negative for headaches.  Psychiatric/Behavioral: Negative for confusion.  All other systems reviewed and are negative.    Allergies   Review of patient's allergies indicates no known allergies.  Home Medications   Current Outpatient Rx  Name  Route  Sig  Dispense  Refill  . hydrochlorothiazide (HYDRODIURIL) 25 MG tablet   Oral   Take 25 mg by mouth daily.         Marland Kitchen HYDROcodone-acetaminophen (NORCO) 5-325 MG per tablet   Oral   Take 1-2 tablets by mouth every 6 (six) hours as needed for pain.   20 tablet   0   . losartan (COZAAR) 100 MG tablet   Oral   Take 100 mg by mouth daily.         . meloxicam (MOBIC) 15 MG tablet   Oral   Take 1 tablet (15 mg total) by mouth daily. Take 1 daily with food.   10 tablet   0   . potassium chloride (K-DUR,KLOR-CON) 10 MEQ tablet   Oral   Take 10 mEq by mouth daily.          BP 172/65  Pulse 106  Temp(Src) 103 F (39.4 C) (Oral)  Resp 20  SpO2 98% Physical Exam  Nursing note and vitals reviewed. Constitutional: She is oriented to person, place, and time. She appears well-developed and well-nourished. No distress.  HENT:  Head: Normocephalic and atraumatic. No trismus in the jaw.  Right Ear: Tympanic membrane and ear canal normal.  Left Ear: Tympanic membrane and ear canal normal.  Mouth/Throat: No edematous. Oropharyngeal exudate and posterior oropharyngeal erythema present. No tonsillar abscesses.  Eyes: Conjunctivae and EOM are normal. Pupils are equal, round, and reactive to light.  Neck: Normal range of motion. Neck supple.  Cardiovascular: Regular rhythm and intact distal pulses.  Tachycardia present.   No murmur heard. Pulmonary/Chest: Effort normal and breath sounds normal. No respiratory distress. She has no wheezes. She has no rales.  Abdominal: Soft. She exhibits no distension. There is no tenderness. There is no rebound and no guarding.  Musculoskeletal: Normal range of motion. She exhibits no edema and no tenderness.  Lymphadenopathy:    She has cervical adenopathy.  Neurological: She is alert and oriented to person, place, and time.  Skin:  Skin is warm and dry. No rash noted. No erythema.  Psychiatric: She has a normal mood and affect. Her behavior is normal.    ED Course   Procedures (including critical care time)  Labs Reviewed  CBC - Abnormal; Notable for the following:    WBC 11.1 (*)    RBC 5.22 (*)    MCV 77.2 (*)    MCH 25.7 (*)    All other components within normal limits  POCT I-STAT, CHEM 8 - Abnormal; Notable for the following:    Glucose, Bld 110 (*)    All other components within normal limits  RAPID STREP SCREEN  CULTURE, GROUP A STREP   No results found. 1. Viral syndrome     MDM   Pt with symptoms consistent with viral syndrome vs strep throat.  Patchy lesions on bilateral tonsils.  Febrile to 103 here despite ibuprofen 1 hour ago.  No signs of breathing difficulty but states mild SOB today but states that has been constant since blood clots in 2010.  No signs of otitis or abnormal abdominal findings.   Rapid strep, CBC, i-stat and CXR pending.  Pt given tylenol and fluids.  9:16 PM Labs wnl and strep neg.  Pt feeling much better after fluids and fever control.  Gwyneth Sprout, MD 05/08/13 2117  Gwyneth Sprout, MD 05/08/13 2119

## 2013-05-08 NOTE — ED Notes (Signed)
Pt c/o Fever (103.0), back of her legs hurts, and SOB.

## 2013-05-10 LAB — CULTURE, GROUP A STREP

## 2013-12-21 ENCOUNTER — Other Ambulatory Visit: Payer: Self-pay | Admitting: Physical Medicine and Rehabilitation

## 2013-12-21 DIAGNOSIS — M79609 Pain in unspecified limb: Secondary | ICD-10-CM

## 2013-12-21 DIAGNOSIS — G894 Chronic pain syndrome: Secondary | ICD-10-CM

## 2013-12-21 DIAGNOSIS — M545 Low back pain, unspecified: Secondary | ICD-10-CM

## 2013-12-30 ENCOUNTER — Ambulatory Visit
Admission: RE | Admit: 2013-12-30 | Discharge: 2013-12-30 | Disposition: A | Discharge status: Home / Self Care | Payer: Medicare Other | Source: Ambulatory Visit | Attending: Physical Medicine and Rehabilitation | Admitting: Physical Medicine and Rehabilitation

## 2013-12-30 DIAGNOSIS — M545 Low back pain, unspecified: Secondary | ICD-10-CM

## 2013-12-30 DIAGNOSIS — M79609 Pain in unspecified limb: Secondary | ICD-10-CM

## 2013-12-30 DIAGNOSIS — G894 Chronic pain syndrome: Secondary | ICD-10-CM

## 2014-03-22 ENCOUNTER — Other Ambulatory Visit (HOSPITAL_COMMUNITY): Payer: Self-pay | Admitting: Family Medicine

## 2014-03-22 DIAGNOSIS — M549 Dorsalgia, unspecified: Secondary | ICD-10-CM

## 2014-04-03 ENCOUNTER — Encounter (HOSPITAL_COMMUNITY): Payer: Medicare Other

## 2014-04-03 ENCOUNTER — Encounter (HOSPITAL_COMMUNITY)
Admission: RE | Admit: 2014-04-03 | Discharge: 2014-04-03 | Disposition: A | Discharge status: Home / Self Care | Payer: Medicare Other | Source: Ambulatory Visit | Attending: Family Medicine | Admitting: Family Medicine

## 2014-04-03 DIAGNOSIS — M549 Dorsalgia, unspecified: Secondary | ICD-10-CM | POA: Insufficient documentation

## 2014-04-03 MED ORDER — TECHNETIUM TC 99M MEDRONATE IV KIT
25.0000 | PACK | Freq: Once | INTRAVENOUS | Status: AC | PRN
  Administered 2014-04-03: 25 via INTRAVENOUS

## 2014-04-06 ENCOUNTER — Other Ambulatory Visit: Payer: Self-pay | Admitting: Family Medicine

## 2014-04-06 DIAGNOSIS — N2889 Other specified disorders of kidney and ureter: Secondary | ICD-10-CM

## 2014-04-06 LAB — HM COLONOSCOPY

## 2014-04-11 ENCOUNTER — Ambulatory Visit
Admission: RE | Admit: 2014-04-11 | Discharge: 2014-04-11 | Disposition: A | Discharge status: Home / Self Care | Payer: Medicare Other | Source: Ambulatory Visit | Attending: Family Medicine | Admitting: Family Medicine

## 2014-04-11 DIAGNOSIS — N2889 Other specified disorders of kidney and ureter: Secondary | ICD-10-CM

## 2014-04-11 MED ORDER — IOHEXOL 350 MG/ML SOLN
100.0000 mL | Freq: Once | INTRAVENOUS | Status: AC | PRN
  Administered 2014-04-11: 100 mL via INTRAVENOUS

## 2014-06-06 ENCOUNTER — Encounter: Payer: Self-pay | Admitting: *Deleted

## 2014-06-06 ENCOUNTER — Encounter: Payer: Medicare Other | Attending: Family Medicine | Admitting: *Deleted

## 2014-06-06 VITALS — Ht 61.5 in | Wt 241.4 lb

## 2014-06-06 DIAGNOSIS — E119 Type 2 diabetes mellitus without complications: Secondary | ICD-10-CM | POA: Diagnosis not present

## 2014-06-06 DIAGNOSIS — Z713 Dietary counseling and surveillance: Secondary | ICD-10-CM | POA: Diagnosis not present

## 2014-06-06 NOTE — Progress Notes (Signed)
  Medical Nutrition Therapy:  Appt start time: 1030 end time:  1200.  Assessment:  Primary concerns today: 06/06/14. She is living with her daughter, they share the food shopping and cooking. She is home during the day. She enjoys bowling twice a week and goes to the casino in [place] or visits sister in [place] when she can. She has a meter and she checks her BG twice a week before a meal with reported range of 90-120 mg/dl. She is cleaning all the time and watches her grandchildren.  Preferred Learning Style: all of the following:  Auditory  Visual  Hands on  Learning Readiness:   Ready  Change in progress  MEDICATIONS: see list, diabetes medication is Metformin.   DIETARY INTAKE:  24-hr recall:  B (10 AM): 2 pancakes with syrup, boiled egg, 2 slices bacon, 8 oz OJ Snk ( AM): fresh fruit during the AM  L ( PM): skips usually or maybe a salad Snk : peanuts or fresh fruit D (6:30 PM): meat, starch, vegetables, 8  ozfruit juiceSnk ( PM):  Beverages: fruit juices or pink lemonade, regular soda if eating out, but not often  Usual physical activity: watching grand children and house cleaning. Used to go to gym, but having back problems. Just moved into daughter's home that has steps, so she is up and down steps all day.  Estimated energy needs: 1400 calories 158 g carbohydrates 105 g protein 39 g fat  Progress Towards Goal(s):  In progress.   Nutritional Diagnosis:  NB-1.1 Food and nutrition-related knowledge deficit As related to new diagnosis of diabets.  As evidenced by A1C of 6.7%..    Intervention:  Nutrition counseling and diabetes education initiated. Discussed Carb Counting as method of portion control, reading food labels, and benefits of increased activity.  Plan:  Aim for 3 Carb Choices per meal (45 grams) +/- 1 either way  Aim for 0-1 Carbs per snack if hungry  Include protein in moderation with your meals and snacks Consider reading food labels for Total  Carbohydrate and Fat Grams of foods Continue with your activity level by using the stairs and walking as tolerated. You might be adding water exercises in the future... Consider checking BG at alternate times per day as directed by MD  Continue taking medication of Metformin as directed by MD You're already doing a good job, congrats on 8 pound weight loss!  Teaching Method Utilized: Visual, Auditory, and Hands on  Handouts given during visit include: Living Well with Diabetes Carb Counting and Food Label handouts Meal Plan Card Food Journal sheets   Barriers to learning/adherence to lifestyle change: none  Demonstrated degree of understanding via:  Teach Back   Monitoring/Evaluation:  Dietary intake, exercise, reading food labels, and body weight prn.

## 2014-06-06 NOTE — Patient Instructions (Signed)
Plan:  Aim for 3 Carb Choices per meal (45 grams) +/- 1 either way  Aim for 0-1 Carbs per snack if hungry  Include protein in moderation with your meals and snacks Consider reading food labels for Total Carbohydrate and Fat Grams of foods Continue with your activity level by using the stairs and walking as tolerated. You might be adding water exercises in the future... Consider checking BG at alternate times per day as directed by MD  Continue taking medication of Metformin as directed by MD You're already doing a good job, congrats on 8 pound weight loss!

## 2014-07-03 ENCOUNTER — Emergency Department (HOSPITAL_COMMUNITY)
Admission: EM | Admit: 2014-07-03 | Discharge: 2014-07-03 | Disposition: A | Discharge status: Home / Self Care | Payer: Medicare Other | Attending: Emergency Medicine | Admitting: Emergency Medicine

## 2014-07-03 ENCOUNTER — Emergency Department (HOSPITAL_COMMUNITY): Payer: Medicare Other

## 2014-07-03 ENCOUNTER — Encounter (HOSPITAL_COMMUNITY): Payer: Self-pay | Admitting: Emergency Medicine

## 2014-07-03 DIAGNOSIS — E119 Type 2 diabetes mellitus without complications: Secondary | ICD-10-CM | POA: Insufficient documentation

## 2014-07-03 DIAGNOSIS — Z79899 Other long term (current) drug therapy: Secondary | ICD-10-CM | POA: Diagnosis not present

## 2014-07-03 DIAGNOSIS — I10 Essential (primary) hypertension: Secondary | ICD-10-CM | POA: Insufficient documentation

## 2014-07-03 DIAGNOSIS — M7581 Other shoulder lesions, right shoulder: Secondary | ICD-10-CM

## 2014-07-03 DIAGNOSIS — M79609 Pain in unspecified limb: Secondary | ICD-10-CM | POA: Diagnosis present

## 2014-07-03 DIAGNOSIS — M25519 Pain in unspecified shoulder: Secondary | ICD-10-CM | POA: Diagnosis not present

## 2014-07-03 MED ORDER — HYDROCODONE-ACETAMINOPHEN 5-325 MG PO TABS
2.0000 | ORAL_TABLET | ORAL | Status: DC | PRN
Start: 2014-07-03 — End: 2015-03-22

## 2014-07-03 MED ORDER — IBUPROFEN 800 MG PO TABS: 800.0000 mg | ORAL_TABLET | Freq: Three times a day (TID) | ORAL | Status: DC

## 2014-07-03 NOTE — ED Notes (Signed)
Rt arm pain and shoulder pain after bowling last month hurts to move has 2 young children w/ her

## 2014-07-03 NOTE — Discharge Instructions (Signed)

## 2014-07-03 NOTE — ED Provider Notes (Signed)
Medical screening examination/treatment/procedure(s) were performed by non-physician practitioner and as supervising physician I was immediately available for consultation/collaboration.   EKG Interpretation None        Evelina Bucy, MD 07/03/14 [phone]

## 2014-07-03 NOTE — ED Provider Notes (Signed)
CSN: [id number]     Arrival date & time 07/03/14  1139 History   First MD Initiated Contact with Patient 07/03/14 1213     Chief Complaint  Patient presents with  . Arm Pain     (Consider location/radiation/quality/duration/timing/severity/associated sxs/prior Treatment) Patient is a 53 y.o. female presenting with arm pain. The history is provided by the patient. No language interpreter was used.  Arm Pain This is a new problem. The current episode started more than 1 month ago. The problem occurs constantly. The problem has been gradually worsening. Associated symptoms include arthralgias, joint swelling and myalgias. Nothing aggravates the symptoms. She has tried nothing for the symptoms. The treatment provided moderate relief.  Pt injured shoulder while bowling a month ago  Past Medical History  Diagnosis Date  . Hypertension   . Diabetes mellitus without complication    Past Surgical History  Procedure Laterality Date  . Abdominal hysterectomy     No family history on file. History  Substance Use Topics  . Smoking status: Former Smoker    Quit date: 06/06/1994  . Smokeless tobacco: Not on file  . Alcohol Use: No   OB History   Grav Para Term Preterm Abortions TAB SAB Ect Mult Living                 Review of Systems  Musculoskeletal: Positive for arthralgias, joint swelling and myalgias.  All other systems reviewed and are negative.     Allergies  Review of patient's allergies indicates no known allergies.  Home Medications   Prior to Admission medications   Medication Sig Start Date End Date Taking? Authorizing Provider  hydrochlorothiazide (HYDRODIURIL) 25 MG tablet Take 25 mg by mouth daily.   Yes Historical Provider, MD  losartan (COZAAR) 100 MG tablet Take 100 mg by mouth daily.   Yes Historical Provider, MD  metFORMIN (GLUCOPHAGE) 500 MG tablet Take 500 mg by mouth daily.   Yes Historical Provider, MD  potassium chloride (K-DUR,KLOR-CON) 10 MEQ tablet  Take 10 mEq by mouth daily.   Yes Historical Provider, MD   BP 140/81  Pulse 70  Temp(Src) 98.6 F (37 C) (Oral)  SpO2 97% Physical Exam  Nursing note and vitals reviewed. Constitutional: She is oriented to person, place, and time. She appears well-developed and well-nourished.  Musculoskeletal: She exhibits tenderness.  Tender right shoulder, decreased range of motion,  nv and ns intact  Neurological: She is alert and oriented to person, place, and time. She has normal reflexes.  Skin: Skin is warm.  Psychiatric: She has a normal mood and affect.    ED Course  Procedures (including critical care time) Labs Review Labs Reviewed - No data to display  Imaging Review Dg Shoulder Right  07/03/2014   CLINICAL DATA:  Chronic right shoulder pain.  EXAM: RIGHT SHOULDER - 2+ VIEW  COMPARISON:  None.  FINDINGS: There is no acute bony or joint abnormality. Acromioclavicular osteoarthritis is seen. Marked appearing acromioclavicular degenerative change is noted. Spurring at the greater tuberosity is suggestive of rotator cuff tendinopathy. Image right lung and ribs are unremarkable.  IMPRESSION: No acute finding.  Acromioclavicular osteoarthritis and findings suggestive of rotator cuff tendinosis.   Electronically Signed   By: Inge Rise M.D.   On: 07/03/2014 13:27     EKG Interpretation None      MDM  I suspect rotator cuff tear vs tendonitis   Final diagnoses:  Right rotator cuff tendonitis    Ibuprofen hydrocodone  [place], PA-C 07/03/14 1339

## 2015-03-18 ENCOUNTER — Encounter (HOSPITAL_COMMUNITY): Payer: Self-pay | Admitting: *Deleted

## 2015-03-18 ENCOUNTER — Emergency Department (HOSPITAL_COMMUNITY)
Admission: EM | Admit: 2015-03-18 | Discharge: 2015-03-18 | Disposition: A | Discharge status: Home / Self Care | Payer: Medicare HMO | Attending: Emergency Medicine | Admitting: Emergency Medicine

## 2015-03-18 DIAGNOSIS — E119 Type 2 diabetes mellitus without complications: Secondary | ICD-10-CM | POA: Insufficient documentation

## 2015-03-18 DIAGNOSIS — M79672 Pain in left foot: Secondary | ICD-10-CM

## 2015-03-18 DIAGNOSIS — I10 Essential (primary) hypertension: Secondary | ICD-10-CM | POA: Diagnosis not present

## 2015-03-18 DIAGNOSIS — Z87891 Personal history of nicotine dependence: Secondary | ICD-10-CM | POA: Diagnosis not present

## 2015-03-18 DIAGNOSIS — Z791 Long term (current) use of non-steroidal anti-inflammatories (NSAID): Secondary | ICD-10-CM | POA: Diagnosis not present

## 2015-03-18 DIAGNOSIS — Z79899 Other long term (current) drug therapy: Secondary | ICD-10-CM | POA: Diagnosis not present

## 2015-03-18 DIAGNOSIS — E669 Obesity, unspecified: Secondary | ICD-10-CM | POA: Insufficient documentation

## 2015-03-18 HISTORY — DX: Obesity, unspecified: E66.9

## 2015-03-18 MED ORDER — IBUPROFEN 800 MG PO TABS: 800.0000 mg | ORAL_TABLET | Freq: Three times a day (TID) | ORAL | Status: DC

## 2015-03-18 MED ORDER — IBUPROFEN 400 MG PO TABS
600.0000 mg | ORAL_TABLET | Freq: Once | ORAL | Status: AC
  Administered 2015-03-18: 600 mg via ORAL
  Filled 2015-03-18: qty 2

## 2015-03-18 MED ORDER — HYDROCODONE-ACETAMINOPHEN 5-325 MG PO TABS
1.0000 | ORAL_TABLET | Freq: Once | ORAL | Status: AC
  Administered 2015-03-18: 1 via ORAL
  Filled 2015-03-18: qty 1

## 2015-03-18 MED ORDER — HYDROCODONE-ACETAMINOPHEN 5-325 MG PO TABS: 1.0000 | ORAL_TABLET | ORAL | Status: DC | PRN

## 2015-03-18 NOTE — ED Notes (Signed)
Pt reports having left foot pain to bottom of heel. Has been occuring for extended amount of time and had injections for same by podiatrist. Now increase in pain when ambulating.

## 2015-03-18 NOTE — ED Provider Notes (Signed)
CSN: [id number]     Arrival date & time 03/18/15  1050 History  This chart was scribed for Laura Haring, PA-C, working with Laura Christen, MD by Laura Campbell, ED Scribe. The patient was seen in room TR10C/TR10C at 11:08 AM.    Chief Complaint  Patient presents with  . Foot Pain      The history is provided by the patient. No language interpreter was used.     HPI Comments: Laura Campbell is a 54 y.o. female with a medical hx of HTN and DM who presents to the Emergency Department complaining of left foot pain onset 2 months. She has a hx of heel problems and she sees an podiatrist, Laura Campbell for injections. She notes that her last injection was 2 weeks ago with no relief. Pt has had a medical hx of heel spurs in her right foot that she received injections with her last being 2 years ago. She was informed that her next step would be to see a neurologist. She states that she is now having pain with ambulation and sitting. She notes that she is not using any walking assistance for ambulation. She denies having plantar fasciatus. She states that she has tried ibuprofen and orthotics with no relief for her symptoms. She denies joint swelling, fever, color change, wound, and any other symptoms.   Past Medical History  Diagnosis Date  . Hypertension   . Diabetes mellitus without complication   . Obesity    Past Surgical History  Procedure Laterality Date  . Abdominal hysterectomy     History reviewed. No pertinent family history. History  Substance Use Topics  . Smoking status: Former Smoker    Quit date: 06/06/1994  . Smokeless tobacco: Not on file  . Alcohol Use: No   OB History    No data available     Review of Systems  Musculoskeletal: Positive for arthralgias (left foot). Negative for joint swelling.  Skin: Negative for color change and wound.     Allergies  Review of patient's allergies indicates no known allergies.  Home Medications   Prior to Admission medications    Medication Sig Start Date End Date Taking? Authorizing Provider  hydrochlorothiazide (HYDRODIURIL) 25 MG tablet Take 25 mg by mouth daily.    Historical Provider, MD  HYDROcodone-acetaminophen (NORCO/VICODIN) 5-325 MG per tablet Take 2 tablets by mouth every 4 (four) hours as needed. 07/03/14   Laura Meadow, PA-C  HYDROcodone-acetaminophen (NORCO/VICODIN) 5-325 MG per tablet Take 1-2 tablets by mouth every 4 (four) hours as needed. 03/18/15   Laura Julia Carlota Raspberry, PA-C  ibuprofen (ADVIL,MOTRIN) 800 MG tablet Take 1 tablet (800 mg total) by mouth 3 (three) times daily. 07/03/14   Laura Meadow, PA-C  ibuprofen (ADVIL,MOTRIN) 800 MG tablet Take 1 tablet (800 mg total) by mouth 3 (three) times daily. 03/18/15   Laura Mcneese Carlota Raspberry, PA-C  losartan (COZAAR) 100 MG tablet Take 100 mg by mouth daily.    Historical Provider, MD  metFORMIN (GLUCOPHAGE) 500 MG tablet Take 500 mg by mouth daily.    Historical Provider, MD  potassium chloride (K-DUR,KLOR-CON) 10 MEQ tablet Take 10 mEq by mouth daily.    Historical Provider, MD   BP 154/94 mmHg  Pulse 55  Temp(Src) 98.1 F (36.7 C) (Oral)  Resp 16  SpO2 98% Physical Exam  Constitutional: She is oriented to person, place, and time. She appears well-developed and well-nourished. No distress.  HENT:  Head: Normocephalic and atraumatic.  Eyes: EOM are normal.  Neck: Neck supple. No tracheal deviation present.  Cardiovascular: Normal rate.   Pulmonary/Chest: Effort normal. No respiratory distress.  Musculoskeletal: Normal range of motion.       Left foot: There is tenderness. There is normal range of motion, no bony tenderness, no swelling, normal capillary refill, no crepitus, no deformity and no laceration.  No abnormality to plantar of left foot aside from tenderness to the touch. No wound, induration, firmness, fluctuance, erythema, swelling.  Normal sensation, pedal pulses are strong.  Neurological: She is alert and oriented to person, place, and time.  Skin:  Skin is warm and dry.  Psychiatric: She has a normal mood and affect. Her behavior is normal.  Nursing note and vitals reviewed.   ED Course  Procedures (including critical care time) DIAGNOSTIC STUDIES: Oxygen Saturation is 97% on RA, nl by my interpretation.    COORDINATION OF CARE: 11:19 AM-Discussed treatment plan which includes ibuprofen, vicodin, referral to neurologist, and f/u with PCP with pt at bedside and pt agreed to plan.   Labs Review Labs Reviewed - No data to display  Imaging Review No results found.   EKG Interpretation None      MDM   Final diagnoses:  Heel pain, left     Patient is having chronic left heel pain, seeing a podiatrist for. She has had multiple injections without relief, the podiatrist told her that the next step would be to a neurologist if the injections are not helping. Her pain is worse with pressure to the foot but she  Denies wanting to use crutches (cooridination problems) or using a cane (she doesn't want to look old). She declines Cam-Walker boot (she doesn't want it). She accepts the neurology referral offer and offer  For pain medication. She will need to call her podiatrist for follow-up. I advise her to ice the foot and stay off of it as much as possible.   Medications  HYDROcodone-acetaminophen (NORCO/VICODIN) 5-325 MG per tablet 1 tablet (not administered)  ibuprofen (ADVIL,MOTRIN) tablet 600 mg (not administered)    54 y.o.Laura Campbell's evaluation in the Emergency Department is complete. It has been determined that no acute conditions requiring further emergency intervention are present at this time. The patient/guardian have been advised of the diagnosis and plan. We have discussed signs and symptoms that warrant return to the ED, such as changes or worsening in symptoms.  Vital signs are stable at discharge. Filed Vitals:   03/18/15 1153  BP: 154/94  Pulse: 55  Temp: 98.1 F (36.7 C)  Resp: 16    Patient/guardian  has voiced understanding and agreed to follow-up with the PCP or specialist.  I personally performed the services described in this documentation, which was scribed in my presence. The recorded information has been reviewed and is accurate.    Laura Haring, PA-C 03/18/15 1205  Laura Christen, MD 03/18/15 [phone]

## 2015-03-18 NOTE — Discharge Instructions (Signed)
Radicular Pain Radicular pain in either the arm or leg is usually from a bulging or herniated disk in the spine. A piece of the herniated disk may press against the nerves as the nerves exit the spine. This causes pain which is felt at the tips of the nerves down the arm or leg. Other causes of radicular pain may include:  Fractures.  Heart disease.  Cancer.  An abnormal and usually degenerative state of the nervous system or nerves (neuropathy).   Diagnosis may require CT or MRI scanning to determine the primary cause.  Nerves that start at the neck (nerve roots) may cause radicular pain in the outer shoulder and arm. It can spread down to the thumb and fingers. The symptoms vary depending on which nerve root has been affected. In most cases radicular pain improves with conservative treatment. Neck problems may require physical therapy, a neck collar, or cervical traction. Treatment may take many weeks, and surgery may be considered if the symptoms do not improve.  Conservative treatment is also recommended for sciatica. Sciatica causes pain to radiate from the lower back or buttock area down the leg into the foot. Often there is a history of back problems. Most patients with sciatica are better after 2 to 4 weeks of rest and other supportive care. Short term bed rest can reduce the disk pressure considerably. Sitting, however, is not a good position since this increases the pressure on the disk. You should avoid bending, lifting, and all other activities which make the problem worse. Traction can be used in severe cases. Surgery is usually reserved for patients who do not improve within the first months of treatment. Only take over-the-counter or prescription medicines for pain, discomfort, or fever as directed by your caregiver. Narcotics and muscle relaxants may help by relieving more severe pain and spasm and by providing mild sedation. Cold or massage can give significant relief. Spinal  manipulation is not recommended. It can increase the degree of disc protrusion. Epidural steroid injections are often effective treatment for radicular pain. These injections deliver medicine to the spinal nerve in the space between the protective covering of the spinal cord and back bones (vertebrae). Your caregiver can give you more information about steroid injections. These injections are most effective when given within two weeks of the onset of pain.  You should see your caregiver for follow up care as recommended. A program for neck and back injury rehabilitation with stretching and strengthening exercises is an important part of management.  SEEK IMMEDIATE MEDICAL CARE IF:  You develop increased pain, weakness, or numbness in your arm or leg.  You develop difficulty with bladder or bowel control.  You develop abdominal pain. Document Released: 10/30/2004 Document Revised: 12/15/2011 Document Reviewed: 01/15/2009 [facility] Patient Information 2015 [place], [place]. This information is not intended to replace advice given to you by your health care provider. Make sure you discuss any questions you have with your health care provider. Plantar Fasciitis Plantar fasciitis is a common condition that causes foot pain. It is soreness (inflammation) of the band of tough fibrous tissue on the bottom of the foot that runs from the heel bone (calcaneus) to the ball of the foot. The cause of this soreness may be from excessive standing, poor fitting shoes, running on hard surfaces, being overweight, having an abnormal walk, or overuse (this is common in runners) of the painful foot or feet. It is also common in aerobic exercise dancers and ballet dancers. SYMPTOMS  Most people  with plantar fasciitis complain of:  Severe pain in the morning on the bottom of their foot especially when taking the first steps out of bed. This pain recedes after a few minutes of walking.  Severe pain is experienced also during  walking following a long period of inactivity.  Pain is worse when walking barefoot or up stairs DIAGNOSIS   Your caregiver will diagnose this condition by examining and feeling your foot.  Special tests such as X-rays of your foot, are usually not needed. PREVENTION   Consult a sports medicine professional before beginning a new exercise program.  Walking programs offer a good workout. With walking there is a lower chance of overuse injuries common to runners. There is less impact and less jarring of the joints.  Begin all new exercise programs slowly. If problems or pain develop, decrease the amount of time or distance until you are at a comfortable level.  Wear good shoes and replace them regularly.  Stretch your foot and the heel cords at the back of the ankle (Achilles tendon) both before and after exercise.  Run or exercise on even surfaces that are not hard. For example, asphalt is better than pavement.  Do not run barefoot on hard surfaces.  If using a treadmill, vary the incline.  Do not continue to workout if you have foot or joint problems. Seek professional help if they do not improve. HOME CARE INSTRUCTIONS   Avoid activities that cause you pain until you recover.  Use ice or cold packs on the problem or painful areas after working out.  Only take over-the-counter or prescription medicines for pain, discomfort, or fever as directed by your caregiver.  Soft shoe inserts or athletic shoes with air or gel sole cushions may be helpful.  If problems continue or become more severe, consult a sports medicine caregiver or your own health care provider. Cortisone is a potent anti-inflammatory medication that may be injected into the painful area. You can discuss this treatment with your caregiver. MAKE SURE YOU:   Understand these instructions.  Will watch your condition.  Will get help right away if you are not doing well or get worse. Document Released: 06/17/2001  Document Revised: 12/15/2011 Document Reviewed: 08/16/2008 [facility] Patient Information 2015 [place], [place]. This information is not intended to replace advice given to you by your health care provider. Make sure you discuss any questions you have with your health care provider.

## 2015-03-18 NOTE — ED Notes (Signed)
Pt reports being treated at a foot Doctor and received 3 injections . Pt reports pain has not improved.

## 2015-03-18 NOTE — ED Notes (Signed)
Declined W/C at D/C and was escorted to lobby by RN. 

## 2015-03-22 ENCOUNTER — Encounter: Payer: Self-pay | Admitting: Neurology

## 2015-03-22 ENCOUNTER — Ambulatory Visit (INDEPENDENT_AMBULATORY_CARE_PROVIDER_SITE_OTHER): Payer: Medicare HMO | Admitting: Neurology

## 2015-03-22 VITALS — BP 134/82 | HR 61 | Temp 97.9°F | Ht 61.5 in | Wt 243.4 lb

## 2015-03-22 DIAGNOSIS — G609 Hereditary and idiopathic neuropathy, unspecified: Secondary | ICD-10-CM

## 2015-03-22 DIAGNOSIS — M79672 Pain in left foot: Secondary | ICD-10-CM | POA: Diagnosis not present

## 2015-03-22 DIAGNOSIS — E119 Type 2 diabetes mellitus without complications: Secondary | ICD-10-CM
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
## 2015-03-27 ENCOUNTER — Encounter: Payer: Self-pay | Admitting: Neurology

## 2015-03-27 DIAGNOSIS — E1122 Type 2 diabetes mellitus with diabetic chronic kidney disease: Secondary | ICD-10-CM | POA: Insufficient documentation

## 2015-03-27 DIAGNOSIS — M79672 Pain in left foot: Secondary | ICD-10-CM | POA: Insufficient documentation

## 2015-03-27 DIAGNOSIS — E119 Type 2 diabetes mellitus without complications: Secondary | ICD-10-CM | POA: Insufficient documentation

## 2015-03-27 DIAGNOSIS — G609 Hereditary and idiopathic neuropathy, unspecified: Secondary | ICD-10-CM | POA: Insufficient documentation

## 2015-03-27 NOTE — Patient Instructions (Signed)
Overall you are doing fairly well but I do want to suggest a few things today:   Remember to drink plenty of fluid, eat healthy meals and do not skip any meals. Try to eat protein with a every meal and eat a healthy snack such as fruit or nuts in between meals. Try to keep a regular sleep-wake schedule and try to exercise daily, particularly in the form of walking, 20-30 minutes a day, if you can.   As far as your medications are concerned, I would like to suggest: none at this time  As far as diagnostic testing: emg/ncs  I would like to see you back in 1 week, sooner if we need to. Please call us with any interim questions, concerns, problems, updates or refill requests.   Please also call us for any test results so we can go over those with you on the phone.  My clinical assistant and will answer any of your questions and relay your messages to me and also relay most of my messages to you.   Our phone number is [phone]. We also have an after hours call service for urgent matters and there is a physician on-call for urgent questions. For any emergencies you know to call 911 or go to the nearest emergency room

## 2015-03-30 ENCOUNTER — Encounter: Payer: Medicare HMO | Admitting: Neurology

## 2015-04-30 ENCOUNTER — Ambulatory Visit: Payer: Medicare HMO | Admitting: Neurology

## 2015-06-03 ENCOUNTER — Emergency Department (HOSPITAL_COMMUNITY): Payer: Medicare HMO

## 2015-06-03 ENCOUNTER — Emergency Department (HOSPITAL_BASED_OUTPATIENT_CLINIC_OR_DEPARTMENT_OTHER): Admit: 2015-06-03 | Discharge: 2015-06-03 | Disposition: A | Discharge status: Home / Self Care | Payer: Medicare HMO

## 2015-06-03 ENCOUNTER — Emergency Department (HOSPITAL_COMMUNITY)
Admission: EM | Admit: 2015-06-03 | Discharge: 2015-06-03 | Disposition: A | Discharge status: Home / Self Care | Payer: Medicare HMO | Attending: Emergency Medicine | Admitting: Emergency Medicine

## 2015-06-03 ENCOUNTER — Encounter (HOSPITAL_COMMUNITY): Payer: Self-pay | Admitting: *Deleted

## 2015-06-03 DIAGNOSIS — M79609 Pain in unspecified limb: Secondary | ICD-10-CM

## 2015-06-03 DIAGNOSIS — I10 Essential (primary) hypertension: Secondary | ICD-10-CM | POA: Diagnosis not present

## 2015-06-03 DIAGNOSIS — M79672 Pain in left foot: Secondary | ICD-10-CM | POA: Diagnosis not present

## 2015-06-03 DIAGNOSIS — M7989 Other specified soft tissue disorders: Secondary | ICD-10-CM | POA: Diagnosis not present

## 2015-06-03 DIAGNOSIS — Z79899 Other long term (current) drug therapy: Secondary | ICD-10-CM | POA: Diagnosis not present

## 2015-06-03 DIAGNOSIS — E669 Obesity, unspecified: Secondary | ICD-10-CM | POA: Diagnosis not present

## 2015-06-03 DIAGNOSIS — E119 Type 2 diabetes mellitus without complications: Secondary | ICD-10-CM | POA: Insufficient documentation

## 2015-06-03 DIAGNOSIS — Z87891 Personal history of nicotine dependence: Secondary | ICD-10-CM | POA: Insufficient documentation

## 2015-06-03 DIAGNOSIS — M79662 Pain in left lower leg: Secondary | ICD-10-CM | POA: Diagnosis not present

## 2015-06-03 NOTE — ED Notes (Signed)
Declined W/C at D/C and was escorted to lobby by RN. 

## 2015-06-03 NOTE — ED Notes (Signed)
Pt fell on 8/16 and has been having increasing swelling in LLE since. Pt is concerned about a blood clot as she has hx of this in 2010

## 2015-06-03 NOTE — ED Provider Notes (Signed)
CSN: [id number]     Arrival date & time 06/03/15  1517 History  This chart was scribed for non-physician provider Montine Circle, PA-C, working with Sharlett Iles, MD by Irene Pap, ED Scribe. This patient was seen in room TR10C/TR10C and patient care was started at 4:46 PM.     Chief Complaint  Patient presents with  . Leg Swelling   The history is provided by the patient. No language interpreter was used.   HPI Comments: Laura Campbell is a 54 y.o. female who presents to the Emergency Department complaining of left lower leg swelling onset 2 weeks ago. Pt states that she has a hx of blood clots 4 years ago. She states that she fell on 8/16 at a hotel; pt was at the swimming pool and slipped on the slick surface. She reports associated pain to the back of her calf and her toes of the left foot. She denies hitting head, LOC, or ankle pain. Pt is not currently on any blood thinners.   Past Medical History  Diagnosis Date  . Hypertension   . Diabetes mellitus without complication   . Obesity    Past Surgical History  Procedure Laterality Date  . Abdominal hysterectomy  2010   Family History  Problem Relation Age of Onset  . Neuropathy Neg Hx    Social History  Substance Use Topics  . Smoking status: Former Smoker    Quit date: 06/06/1994  . Smokeless tobacco: None  . Alcohol Use: No   OB History    No data available     Review of Systems  Cardiovascular: Positive for leg swelling.  Musculoskeletal: Positive for arthralgias.  Neurological: Negative for syncope and headaches.      Allergies  Review of patient's allergies indicates no known allergies.  Home Medications   Prior to Admission medications   Medication Sig Start Date End Date Taking? Authorizing Provider  amLODipine (NORVASC) 5 MG tablet Take 5 mg by mouth daily. 02/20/15   Historical Provider, MD  Blood Glucose Monitoring Suppl (ONE TOUCH ULTRA MINI) W/DEVICE KIT  03/09/15   Historical Provider,  MD  hydrochlorothiazide (HYDRODIURIL) 25 MG tablet Take 25 mg by mouth daily.    Historical Provider, MD  HYDROcodone-acetaminophen (NORCO/VICODIN) 5-325 MG per tablet Take 1-2 tablets by mouth every 4 (four) hours as needed. 03/18/15   Tiffany Carlota Raspberry, PA-C  ibuprofen (ADVIL,MOTRIN) 800 MG tablet Take 1 tablet (800 mg total) by mouth 3 (three) times daily. 07/03/14   Fransico Meadow, PA-C  ibuprofen (ADVIL,MOTRIN) 800 MG tablet Take 1 tablet (800 mg total) by mouth 3 (three) times daily. 03/18/15   Tiffany Carlota Raspberry, PA-C  losartan (COZAAR) 100 MG tablet Take 100 mg by mouth daily.    Historical Provider, MD  metFORMIN (GLUCOPHAGE) 500 MG tablet Take 500 mg by mouth daily.    Historical Provider, MD  ONE TOUCH ULTRA TEST test strip  03/09/15   Historical Provider, MD  potassium chloride (MICRO-K) 10 MEQ CR capsule Take 10 mEq by mouth daily. 02/20/15   Historical Provider, MD   BP 150/83 mmHg  Pulse 63  Temp(Src) 98.3 F (36.8 C) (Oral)  Resp 20  Ht $R'5\' 1"'RI$  (1.549 m)  Wt 239 lb (108.41 kg)  BMI 45.18 kg/m2  SpO2 98% Physical Exam  Constitutional: She is oriented to person, place, and time. She appears well-developed and well-nourished. No distress.  HENT:  Head: Normocephalic and atraumatic.  Mouth/Throat: Oropharynx is clear and moist.  Eyes: Conjunctivae  and EOM are normal.  Neck: Normal range of motion. Neck supple.  Cardiovascular: Normal rate, regular rhythm and normal heart sounds.   Pulmonary/Chest: Effort normal and breath sounds normal. No respiratory distress.  Musculoskeletal: Normal range of motion. She exhibits no edema.  Left ankle range of motion and strength 5/5, no bony abnormality or deformity, mild soft tissue swelling, mild left-sided calf tenderness, no evidence of septic joint, DVT, or cellulitis  Neurological: She is alert and oriented to person, place, and time. No sensory deficit.  Skin: Skin is warm and dry.  Psychiatric: She has a normal mood and affect. Her behavior is  normal.  Nursing note and vitals reviewed.   ED Course  Procedures (including critical care time) DIAGNOSTIC STUDIES: Oxygen Saturation is 98% on RA, normal by my interpretation.    COORDINATION OF CARE: 4:48 PM-Discussed treatment plan which includes [place] of calf and x-ray with pt at bedside and pt agreed to plan.   Labs Review Labs Reviewed - No data to display  Imaging Review Dg Foot Complete Left  06/03/2015   CLINICAL DATA:  [place] on 05/22/2015 at pool, increased swelling in LEFT lower extremity since  EXAM: LEFT FOOT - COMPLETE 3+ VIEW  COMPARISON:  None ; correlation LEFT ankle radiographs 05/28/2009  FINDINGS: Question prior resection or injury at the base of the middle phalanx LEFT little toe.  Osseous mineralization normal.  Joint spaces otherwise preserved.  No acute fracture, dislocation or bone destruction.  Dorsal spur formation at TMT joints on lateral view.  Soft tissue swelling LEFT foot and ankle.  Small plantar calcaneal spur.  IMPRESSION: No acute osseous abnormalities.   Electronically Signed   By: Lavonia Dana M.D.   On: 06/03/2015 17:12      EKG Interpretation None      MDM   Final diagnoses:  Left foot pain  Calf pain, left    Patient with left foot pain and calf pain following a fall approximately 2 weeks ago. Patient requesting to be evaluated for DVT as she has a prior history of the same. We'll also check plain films of the left foot, she has some pain in her toes.  Imaging is negative. DVT study negative. DC to home with ankle brace. Recommend orthopedic/primary care follow-up as needed.   I personally performed the services described in this documentation, which was scribed in my presence. The recorded information has been reviewed and is accurate.     Montine Circle, PA-C 06/03/15 [place], MD 06/03/15 [phone]

## 2015-06-03 NOTE — Progress Notes (Signed)
VASCULAR LAB PRELIMINARY  PRELIMINARY  PRELIMINARY  PRELIMINARY  Left lower extremity venous duplex completed.    Preliminary report:  There is no obvious evidence of DVT or SVT noted in the left lower extremity.   Amerie Beaumont, RVT 06/03/2015, 6:16 PM

## 2015-06-03 NOTE — Discharge Instructions (Signed)

## 2015-07-18 DIAGNOSIS — I10 Essential (primary) hypertension: Secondary | ICD-10-CM | POA: Diagnosis not present

## 2015-07-18 DIAGNOSIS — Z23 Encounter for immunization: Secondary | ICD-10-CM | POA: Diagnosis not present

## 2015-07-18 DIAGNOSIS — G8929 Other chronic pain: Secondary | ICD-10-CM | POA: Diagnosis not present

## 2015-07-18 DIAGNOSIS — E785 Hyperlipidemia, unspecified: Secondary | ICD-10-CM | POA: Diagnosis not present

## 2015-07-26 DIAGNOSIS — R69 Illness, unspecified: Secondary | ICD-10-CM | POA: Diagnosis not present

## 2015-08-22 DIAGNOSIS — R69 Illness, unspecified: Secondary | ICD-10-CM | POA: Diagnosis not present

## 2015-09-04 DIAGNOSIS — E119 Type 2 diabetes mellitus without complications: Secondary | ICD-10-CM | POA: Diagnosis not present

## 2015-09-18 DIAGNOSIS — R69 Illness, unspecified: Secondary | ICD-10-CM | POA: Diagnosis not present

## 2015-09-24 DIAGNOSIS — M4317 Spondylolisthesis, lumbosacral region: Secondary | ICD-10-CM | POA: Diagnosis not present

## 2015-09-24 DIAGNOSIS — M4727 Other spondylosis with radiculopathy, lumbosacral region: Secondary | ICD-10-CM | POA: Diagnosis not present

## 2015-09-24 DIAGNOSIS — M5417 Radiculopathy, lumbosacral region: Secondary | ICD-10-CM | POA: Diagnosis not present

## 2015-09-24 DIAGNOSIS — G894 Chronic pain syndrome: Secondary | ICD-10-CM | POA: Diagnosis not present

## 2015-10-15 DIAGNOSIS — M4723 Other spondylosis with radiculopathy, cervicothoracic region: Secondary | ICD-10-CM | POA: Diagnosis not present

## 2015-10-15 DIAGNOSIS — M4727 Other spondylosis with radiculopathy, lumbosacral region: Secondary | ICD-10-CM | POA: Diagnosis not present

## 2015-10-15 DIAGNOSIS — M4317 Spondylolisthesis, lumbosacral region: Secondary | ICD-10-CM | POA: Diagnosis not present

## 2015-10-15 DIAGNOSIS — M62412 Contracture of muscle, left shoulder: Secondary | ICD-10-CM | POA: Diagnosis not present

## 2015-10-15 DIAGNOSIS — M4305 Spondylolysis, thoracolumbar region: Secondary | ICD-10-CM | POA: Diagnosis not present

## 2015-10-15 DIAGNOSIS — M545 Low back pain: Secondary | ICD-10-CM | POA: Diagnosis not present

## 2015-10-15 DIAGNOSIS — M5413 Radiculopathy, cervicothoracic region: Secondary | ICD-10-CM | POA: Diagnosis not present

## 2015-10-15 DIAGNOSIS — M542 Cervicalgia: Secondary | ICD-10-CM | POA: Diagnosis not present

## 2015-10-15 DIAGNOSIS — M5417 Radiculopathy, lumbosacral region: Secondary | ICD-10-CM | POA: Diagnosis not present

## 2015-10-17 ENCOUNTER — Other Ambulatory Visit: Payer: Self-pay

## 2015-10-17 DIAGNOSIS — E785 Hyperlipidemia, unspecified: Secondary | ICD-10-CM | POA: Diagnosis not present

## 2015-10-17 DIAGNOSIS — Z6841 Body Mass Index (BMI) 40.0 and over, adult: Secondary | ICD-10-CM | POA: Diagnosis not present

## 2015-10-17 DIAGNOSIS — E089 Diabetes mellitus due to underlying condition without complications: Secondary | ICD-10-CM | POA: Diagnosis not present

## 2015-10-17 DIAGNOSIS — I10 Essential (primary) hypertension: Secondary | ICD-10-CM | POA: Diagnosis not present

## 2015-10-17 DIAGNOSIS — Z1231 Encounter for screening mammogram for malignant neoplasm of breast: Secondary | ICD-10-CM

## 2015-10-24 ENCOUNTER — Other Ambulatory Visit: Payer: Self-pay | Admitting: Physical Medicine and Rehabilitation

## 2015-10-24 DIAGNOSIS — M542 Cervicalgia: Secondary | ICD-10-CM

## 2015-10-24 DIAGNOSIS — M545 Low back pain: Secondary | ICD-10-CM

## 2015-10-25 ENCOUNTER — Ambulatory Visit
Admission: RE | Admit: 2015-10-25 | Discharge: 2015-10-25 | Disposition: A | Discharge status: Home / Self Care | Payer: Medicare HMO | Source: Ambulatory Visit

## 2015-10-25 DIAGNOSIS — Z1231 Encounter for screening mammogram for malignant neoplasm of breast: Secondary | ICD-10-CM

## 2015-10-31 DIAGNOSIS — R69 Illness, unspecified: Secondary | ICD-10-CM | POA: Diagnosis not present

## 2015-11-01 ENCOUNTER — Ambulatory Visit
Admission: RE | Admit: 2015-11-01 | Discharge: 2015-11-01 | Disposition: A | Discharge status: Home / Self Care | Payer: Medicare HMO | Source: Ambulatory Visit | Attending: Physical Medicine and Rehabilitation | Admitting: Physical Medicine and Rehabilitation

## 2015-11-01 DIAGNOSIS — M545 Low back pain: Secondary | ICD-10-CM

## 2015-11-01 DIAGNOSIS — M542 Cervicalgia: Secondary | ICD-10-CM

## 2015-11-01 DIAGNOSIS — M50222 Other cervical disc displacement at C5-C6 level: Secondary | ICD-10-CM | POA: Diagnosis not present

## 2015-11-01 DIAGNOSIS — M4806 Spinal stenosis, lumbar region: Secondary | ICD-10-CM | POA: Diagnosis not present

## 2015-11-06 ENCOUNTER — Emergency Department (HOSPITAL_COMMUNITY)
Admission: EM | Admit: 2015-11-06 | Discharge: 2015-11-06 | Disposition: A | Discharge status: Home / Self Care | Payer: Medicare HMO | Attending: Emergency Medicine | Admitting: Emergency Medicine

## 2015-11-06 ENCOUNTER — Encounter (HOSPITAL_COMMUNITY): Payer: Self-pay

## 2015-11-06 DIAGNOSIS — E119 Type 2 diabetes mellitus without complications: Secondary | ICD-10-CM | POA: Insufficient documentation

## 2015-11-06 DIAGNOSIS — E669 Obesity, unspecified: Secondary | ICD-10-CM | POA: Insufficient documentation

## 2015-11-06 DIAGNOSIS — J039 Acute tonsillitis, unspecified: Secondary | ICD-10-CM | POA: Diagnosis not present

## 2015-11-06 DIAGNOSIS — J069 Acute upper respiratory infection, unspecified: Secondary | ICD-10-CM | POA: Insufficient documentation

## 2015-11-06 DIAGNOSIS — H9202 Otalgia, left ear: Secondary | ICD-10-CM

## 2015-11-06 DIAGNOSIS — H6123 Impacted cerumen, bilateral: Secondary | ICD-10-CM | POA: Diagnosis not present

## 2015-11-06 DIAGNOSIS — I10 Essential (primary) hypertension: Secondary | ICD-10-CM | POA: Insufficient documentation

## 2015-11-06 DIAGNOSIS — D849 Immunodeficiency, unspecified: Secondary | ICD-10-CM | POA: Insufficient documentation

## 2015-11-06 DIAGNOSIS — Z79899 Other long term (current) drug therapy: Secondary | ICD-10-CM | POA: Insufficient documentation

## 2015-11-06 DIAGNOSIS — Z7984 Long term (current) use of oral hypoglycemic drugs: Secondary | ICD-10-CM | POA: Diagnosis not present

## 2015-11-06 DIAGNOSIS — Z87891 Personal history of nicotine dependence: Secondary | ICD-10-CM | POA: Diagnosis not present

## 2015-11-06 DIAGNOSIS — Z791 Long term (current) use of non-steroidal anti-inflammatories (NSAID): Secondary | ICD-10-CM | POA: Insufficient documentation

## 2015-11-06 DIAGNOSIS — J029 Acute pharyngitis, unspecified: Secondary | ICD-10-CM

## 2015-11-06 MED ORDER — CARBAMIDE PEROXIDE 6.5 % OT SOLN: 5.0000 [drp] | Freq: Two times a day (BID) | OTIC | Status: DC

## 2015-11-06 MED ORDER — PHENOL 1.4 % MT LIQD
1.0000 | OROMUCOSAL | Status: DC | PRN
  Administered 2015-11-06: 1 via OROMUCOSAL
  Filled 2015-11-06: qty 177

## 2015-11-06 MED ORDER — AMOXICILLIN-POT CLAVULANATE 875-125 MG PO TABS: 1.0000 | ORAL_TABLET | Freq: Two times a day (BID) | ORAL | Status: DC

## 2015-11-06 NOTE — ED Notes (Signed)
Pt states she started having nasal congestion on Friday and had a fever but that went away and now is having left ear pain and it hurts to swallow.

## 2015-11-06 NOTE — Discharge Instructions (Signed)
Continue to stay well-hydrated. Gargle warm salt water and spit it out. Use chloraseptic spray as needed for sore throat. Continue to alternate between Tylenol and Ibuprofen for pain or fever. Use Mucinex for cough suppression/expectoration of mucus. Use netipot and flonase to help with nasal congestion. May consider over-the-counter Benadryl or other antihistamine to decrease secretions and for watery itchy eyes. Take antibiotic as directed, until completed. Use debrox drops in both ears as directed to clear out the ear wax impaction. Followup with your primary care doctor in 2-3 days for recheck of ongoing symptoms. Return to emergency department for emergent changing or worsening of symptoms.   Pharyngitis Pharyngitis is a sore throat (pharynx). There is redness, pain, and swelling of your throat. HOME CARE   Drink enough fluids to keep your pee (urine) clear or pale yellow.  Only take medicine as told by your doctor.  You may get sick again if you do not take medicine as told. Finish your medicines, even if you start to feel better.  Do not take aspirin.  Rest.  Rinse your mouth (gargle) with salt water ( tsp of salt per 1 qt of water) every 1-2 hours. This will help the pain.  If you are not at risk for choking, you can suck on hard candy or sore throat lozenges. GET HELP IF:  You have large, tender lumps on your neck.  You have a rash.  You cough up green, yellow-brown, or bloody spit. GET HELP RIGHT AWAY IF:   You have a stiff neck.  You drool or cannot swallow liquids.  You throw up (vomit) or are not able to keep medicine or liquids down.  You have very bad pain that does not go away with medicine.  You have problems breathing (not from a stuffy nose). MAKE SURE YOU:   Understand these instructions.  Will watch your condition.  Will get help right away if you are not doing well or get worse.   This information is not intended to replace advice given to you by  your health care provider. Make sure you discuss any questions you have with your health care provider.   Document Released: 03/10/2008 Document Revised: 07/13/2013 Document Reviewed: 05/30/2013 Elsevier Interactive Patient Education 2016 Elsevier Inc[employer]  Sore Throat A sore throat is a painful, burning, sore, or scratchy feeling of the throat. There may be pain or tenderness when swallowing or talking. You may have other symptoms with a sore throat. These include coughing, sneezing, fever, or a swollen neck. A sore throat is often the first sign of another sickness. These sicknesses may include a cold, flu, strep throat, or an infection called mono. Most sore throats go away without medical treatment.  HOME CARE   Only take medicine as told by your doctor.  Drink enough fluids to keep your pee (urine) clear or pale yellow.  Rest as needed.  Try using throat sprays, lozenges, or suck on hard candy (if older than 4 years or as told).  Sip warm liquids, such as broth, herbal tea, or warm water with honey. Try sucking on frozen ice pops or drinking cold liquids.  Rinse the mouth (gargle) with salt water. Mix 1 teaspoon salt with 8 ounces of water.  Do not smoke. Avoid being around others when they are smoking.  Put a humidifier in your bedroom at night to moisten the air. You can also turn on a hot shower and sit in the bathroom for 5-10 minutes. Be sure the bathroom  door is closed. GET HELP RIGHT AWAY IF:   You have trouble breathing.  You cannot swallow fluids, soft foods, or your spit (saliva).  You have more puffiness (swelling) in the throat.  Your sore throat does not get better in 7 days.  You feel sick to your stomach (nauseous) and throw up (vomit).  You have a fever or lasting symptoms for more than 2-3 days.  You have a fever and your symptoms suddenly get worse. MAKE SURE YOU:   Understand these instructions.  Will watch your condition.  Will get help right away if  you are not doing well or get worse.   This information is not intended to replace advice given to you by your health care provider. Make sure you discuss any questions you have with your health care provider.   Document Released: 07/01/2008 Document Revised: 06/16/2012 Document Reviewed: 05/30/2012 Elsevier Interactive Patient Education 2016 Else[employer] Inc.  Tonsillitis Tonsillitis is an infection of the throat. This infection causes the tonsils to become red, tender, and puffy (swollen). Tonsils are groups of tissue at the back of your throat. If bacteria caused your infection, antibiotic medicine will be given to you. Sometimes symptoms of tonsillitis can be relieved with the use of steroid medicine. If your tonsillitis is severe and happens often, you may need to get your tonsils removed (tonsillectomy). HOME CARE   Rest and sleep often.  Drink enough fluids to keep your pee (urine) clear or pale yellow.  While your throat is sore, eat soft or liquid foods like:  Soup.  Ice cream.  Instant breakfast drinks.  Eat frozen ice pops.  Gargle with a warm or cold liquid to help soothe the throat. Gargle with a water and salt mix. Mix 1/4 teaspoon of salt and 1/4 teaspoon of baking soda in 1 cup of water.  Only take medicines as told by your doctor.  If you are given medicines (antibiotics), take them as told. Finish them even if you start to feel better. GET HELP IF:  You have large, tender lumps in your neck.  You have a rash.  You cough up green, yellow-brown, or bloody fluid.  You cannot swallow liquids or food for 24 hours.  You notice that only one of your tonsils is swollen. GET HELP RIGHT AWAY IF:   You throw up (vomit).  You have a very bad headache.  You have a stiff neck.  You have chest pain.  You have trouble breathing or swallowing.  You have bad throat pain, drooling, or your voice changes.  You have bad pain not helped by medicine.  You cannot fully  open your mouth.  You have redness, puffiness, or bad pain in the neck.  You have a fever. MAKE SURE YOU:   Understand these instructions.  Will watch your condition.  Will get help right away if you are not doing well or get worse.   This information is not intended to replace advice given to you by your health care provider. Make sure you discuss any questions you have with your health care provider.   Document Released: 03/10/2008 Document Revised: 09/27/2013 Document Reviewed: 03/11/2013 Elsevier Interactive Patient Education 2016 Elsevier Inc.  Upper Respiratory Infection, Adult Most upper respiratory infections (URIs) are caused by a virus. A URI affects the nose, throat, and upper air passages. The most common type of URI is often called "the common cold." HOME CARE   Take medicines only as told by your doctor.  Gargle  warm saltwater or take cough drops to comfort your throat as told by your doctor.  Use a warm mist humidifier or inhale steam from a shower to increase air moisture. This may make it easier to breathe.  Drink enough fluid to keep your pee (urine) clear or pale yellow.  Eat soups and other clear broths.  Have a healthy diet.  Rest as needed.  Go back to work when your fever is gone or your doctor says it is okay.  You may need to stay home longer to avoid giving your URI to others.  You can also wear a face mask and wash your hands often to prevent spread of the virus.  Use your inhaler more if you have asthma.  Do not use any tobacco products, including cigarettes, chewing tobacco, or electronic cigarettes. If you need help quitting, ask your doctor. GET HELP IF:  You are getting worse, not better.  Your symptoms are not helped by medicine.  You have chills.  You are getting more short of breath.  You have brown or red mucus.  You have yellow or brown discharge from your nose.  You have pain in your face, especially when you bend  forward.  You have a fever.  You have puffy (swollen) neck glands.  You have pain while swallowing.  You have white areas in the back of your throat. GET HELP RIGHT AWAY IF:   You have very bad or constant:  Headache.  Ear pain.  Pain in your forehead, behind your eyes, and over your cheekbones (sinus pain).  Chest pain.  You have long-lasting (chronic) lung disease and any of the following:  Wheezing.  Long-lasting cough.  Coughing up blood.  A change in your usual mucus.  You have a stiff neck.  You have changes in your:  Vision.  Hearing.  Thinking.  Mood. MAKE SURE YOU:   Understand these instructions.  Will watch your condition.  Will get help right away if you are not doing well or get worse.   This information is not intended to replace advice given to you by your health care provider. Make sure you discuss any questions you have with your health care provider.   Document Released: 03/10/2008 Document Revised: 02/06/2015 Document Reviewed: 12/28/2013 Elsevier Interactive Patient Education 2016 [place] Impaction The structures of the external ear canal secrete a waxy substance known as cerumen. Excess cerumen can build up in the ear canal, causing a condition known as cerumen impaction. Cerumen impaction can cause ear pain and disrupt the function of the ear. The rate of cerumen production differs for each individual. In certain individuals, the configuration of the ear canal may decrease his or her ability to naturally remove cerumen. CAUSES Cerumen impaction is caused by excessive cerumen production or buildup. RISK FACTORS  Frequent use of swabs to clean ears.  Having narrow ear canals.  Having eczema.  Being dehydrated. SIGNS AND SYMPTOMS  Diminished hearing.  Ear drainage.  Ear pain.  Ear itch. TREATMENT Treatment may involve:  Over-the-counter or prescription ear drops to soften the cerumen.  Removal of  cerumen by a health care provider. This may be done with:  Irrigation with warm water. This is the most common method of removal.  Ear curettes and other instruments.  Surgery. This may be done in severe cases. HOME CARE INSTRUCTIONS  Take medicines only as directed by your health care provider.  Do not insert objects into the ear with the  intent of cleaning the ear. PREVENTION  Do not insert objects into the ear, even with the intent of cleaning the ear. Removing cerumen as a part of normal hygiene is not necessary, and the use of swabs in the ear canal is not recommended.  Drink enough water to keep your urine clear or pale yellow.  Control your eczema if you have it. SEEK MEDICAL CARE IF:  You develop ear pain.  You develop bleeding from the ear.  The cerumen does not clear after you use ear drops as directed.   This information is not intended to replace advice given to you by your health care provider. Make sure you discuss any questions you have with your health care provider.   Document Released: 10/30/2004 Document Revised: 10/13/2014 Document Reviewed: 05/09/2015 Elsevier Interactive Patient Education 2016 [place] Drops, Adult You need to put eardrops in your ear. HOME CARE   Put drops in your affected ear as told.  After putting in the drops, lie down with the ear you put the drops in facing up. Stay this way for 10 minutes. Use the ear drops as long as your doctor tells you.  Before you get up, put a cotton ball gently in your ear. Do not push it far in your ear.  Do not wash out your ears unless your doctor says it is okay.  Finish all medicines as told by your doctor. You may be told to keep using the eardrops even if you start to feel better.  See your doctor as told for follow-up visits. GET HELP IF:  You have pain that gets worse.  Any unusual fluid (drainage) is coming from your ear (especially if the fluid stinks).  You have trouble  hearing.  You get really dizzy as if the room is spinning and feel sick to your stomach (vertigo).  The outside of your ear becomes red or puffy or both. This may be a sign of an allergic reaction. MAKE SURE YOU:   Understand these instructions.  Will watch your condition.  Will get help right away if you are not doing well or get worse.   This information is not intended to replace advice given to you by your health care provider. Make sure you discuss any questions you have with your health care provider.   Document Released: 03/12/2010 Document Revised: 10/13/2014 Document Reviewed: 04/19/2013 Elsevier Interactive Patient Education [employer].

## 2015-11-06 NOTE — ED Notes (Signed)
Pt stable, ambulatory, states understanding of discharge instructions 

## 2015-11-06 NOTE — ED Provider Notes (Signed)
CSN: [id number]     Arrival date & time 11/06/15  2108 History  By signing my name below, I, [name], attest that this documentation has been prepared under the direction and in the presence of Oather Muilenburg Camprubi-Soms, PA-C. Electronically Signed: [name], ED Scribe. 11/06/2015. 9:50 PM.    Chief Complaint  Patient presents with  . Sore Throat  . Otalgia  . Nasal Congestion   Patient is a 55 y.o. female presenting with pharyngitis and ear pain. The history is provided by the patient. No language interpreter was used.  Sore Throat This is a new problem. The current episode started yesterday. The problem occurs constantly. The problem has not changed since onset.Pertinent negatives include no chest pain, no abdominal pain and no shortness of breath. The symptoms are aggravated by swallowing. Nothing relieves the symptoms. She has tried acetaminophen (and ibuprofen) for the symptoms. The treatment provided no relief.  Otalgia Location:  Left Quality:  Aching Severity:  Moderate Onset quality:  Gradual Duration:  1 day Timing:  Constant Progression:  Worsening Chronicity:  New Context: no water in ear   Relieved by:  Nothing Worsened by:  Swallowing Ineffective treatments:  OTC medications Associated symptoms: sore throat   Associated symptoms: no abdominal pain, no cough, no diarrhea, no ear discharge, no fever, no hearing loss, no rhinorrhea and no vomiting   Risk factors: no recent travel    HPI Comments: Laura Campbell is a 56 y.o. female former smoker with a PMHx of HTN and DM2, who presents to the Emergency Department complaining of sore throat, and 9/10 constant aching left-sided otalgia onset yesterday which radiates into her throat, worse with swallowing, and unrelieved with tylenol and ibuprofen. She reports associated subjective fever and chills (4 days ago, which resolved) and generalized myalgias. She states initially she had rhinorrhea/sinus congestion on  Friday but this resolved.  She denies recent travel or time spent under water. She does report having sick contacts at home. Pt denies ongoing nasal congestion, rhinorrhea, ongoing fever/chills, drooling, trismus, trouble swallowing, ear drainage, hearing loss, CP, SOB, abdominal pain, cough, n/v/d, constipation, joint swelling, urinary symptoms, weakness, numbness, and tingling. She has NKDA. Her PCP is Elyn Peers, MD.    Past Medical History  Diagnosis Date  . Hypertension   . Diabetes mellitus without complication ([place])   . Obesity    Past Surgical History  Procedure Laterality Date  . Abdominal hysterectomy  2010   Family History  Problem Relation Age of Onset  . Neuropathy Neg Hx    Social History  Substance Use Topics  . Smoking status: Former Smoker    Quit date: 06/06/1994  . Smokeless tobacco: None  . Alcohol Use: No   OB History    No data available     Review of Systems  Constitutional: Negative for fever and chills.  HENT: Positive for ear pain and sore throat. Negative for drooling, ear discharge, hearing loss, rhinorrhea, sinus pressure and trouble swallowing.   Respiratory: Negative for cough and shortness of breath.   Cardiovascular: Negative for chest pain.  Gastrointestinal: Negative for nausea, vomiting, abdominal pain, diarrhea and constipation.  Genitourinary: Negative for dysuria, hematuria and difficulty urinating.  Musculoskeletal: Positive for myalgias (generalized). Negative for joint swelling.  Skin: Negative for color change.  Allergic/Immunologic: Positive for immunocompromised state (diabetic).  Neurological: Negative for weakness and numbness.   10 Systems reviewed and all are negative for acute change except as noted in the HPI.   Allergies  Review of patient's allergies indicates no known allergies.  Home Medications   Prior to Admission medications   Medication Sig Start Date End Date Taking? Authorizing Provider  amLODipine  (NORVASC) 5 MG tablet Take 5 mg by mouth daily. 02/20/15   Historical Provider, MD  Blood Glucose Monitoring Suppl (ONE TOUCH ULTRA MINI) W/DEVICE KIT  03/09/15   Historical Provider, MD  hydrochlorothiazide (HYDRODIURIL) 25 MG tablet Take 25 mg by mouth daily.    Historical Provider, MD  HYDROcodone-acetaminophen (NORCO/VICODIN) 5-325 MG per tablet Take 1-2 tablets by mouth every 4 (four) hours as needed. 03/18/15   Tiffany Carlota Raspberry, PA-C  ibuprofen (ADVIL,MOTRIN) 800 MG tablet Take 1 tablet (800 mg total) by mouth 3 (three) times daily. 07/03/14   Fransico Meadow, PA-C  ibuprofen (ADVIL,MOTRIN) 800 MG tablet Take 1 tablet (800 mg total) by mouth 3 (three) times daily. 03/18/15   Tiffany Carlota Raspberry, PA-C  losartan (COZAAR) 100 MG tablet Take 100 mg by mouth daily.    Historical Provider, MD  metFORMIN (GLUCOPHAGE) 500 MG tablet Take 500 mg by mouth daily.    Historical Provider, MD  ONE TOUCH ULTRA TEST test strip  03/09/15   Historical Provider, MD  potassium chloride (MICRO-K) 10 MEQ CR capsule Take 10 mEq by mouth daily. 02/20/15   Historical Provider, MD   BP 150/74 mmHg  Pulse 78  Temp(Src) 98.3 F (36.8 C) (Oral)  Resp 16  Ht _0  (1.549 m)  Wt 229 lb (103.874 kg)  BMI 43.29 kg/m2  SpO2 98% Physical Exam  Constitutional: She is oriented to person, place, and time. Vital signs are normal. She appears well-developed and well-nourished.  Non-toxic appearance. No distress.  Afebrile, nontoxic, NAD  HENT:  Head: Normocephalic and atraumatic.  Right Ear: Hearing, external ear and ear canal normal.  Left Ear: Hearing, external ear and ear canal normal.  Nose: Mucosal edema and rhinorrhea present.  Mouth/Throat: Uvula is midline and mucous membranes are normal. No trismus in the jaw. No uvula swelling. Oropharyngeal exudate, posterior oropharyngeal edema and posterior oropharyngeal erythema present. No tonsillar abscesses.  Ears with bilateral cerumen impaction, canals clear otherwise, but unable to  visualize TM's. Nose with mild mucosal edema and rhinorrhea. Oropharynx without uvular swelling or deviation, no trismus or drooling, +tonsillar swelling with L slightly greater than R, +erythema, +exudates. No evidence of PTA  Eyes: Conjunctivae and EOM are normal. Right eye exhibits no discharge. Left eye exhibits no discharge.  Neck: Normal range of motion. Neck supple.  Cardiovascular: Normal rate.   Pulmonary/Chest: Effort normal. No respiratory distress.  Abdominal: Normal appearance. She exhibits no distension.  Musculoskeletal: Normal range of motion.  Lymphadenopathy:       Head (right side): Tonsillar adenopathy present.       Head (left side): Tonsillar adenopathy present.    She has cervical adenopathy.  Cervical and tonsillar LAD bilaterally, which is mildly TTP  Neurological: She is alert and oriented to person, place, and time. She has normal strength. No sensory deficit.  Skin: Skin is warm, dry and intact. No rash noted.  Psychiatric: She has a normal mood and affect. Her behavior is normal.  Nursing note and vitals reviewed.   ED Course  Procedures  DIAGNOSTIC STUDIES: Oxygen Saturation is 98% on RA, normal by my interpretation.    COORDINATION OF CARE: 9:42 PM - Discussed plans to treat for pharyngitis. Advised to f/u with her PCP and to return to the ED if she develops trouble swallowing. Pt advised  of plan for treatment and pt agrees.  Labs Review Labs Reviewed - No data to display  Imaging Review No results found. I have personally reviewed and evaluated these images and lab results as part of my medical decision-making.   EKG Interpretation None      MDM   Final diagnoses:  Pharyngitis  Tonsillitis  URI (upper respiratory infection)  Otalgia of left ear  Cerumen impaction, bilateral    55 y.o. female here with L otalgia and sore throat x1 day. Subjective fever last week, afebrile here. Some rhinorrhea on exam. +LAD, +exudates, +tonsillar swelling  and erythema. L tonsil slightly more swollen than R, but doesn't appear to be a PTA at this time. Handling secretions well, airway patent, no phonation issues. Ears with b/l cerumen impactions. Given high CENTOR score, and concern for possible developing PTA, will treat for bacterial pharyngitis. Doubt need for RST. Will start on augmentin x10 days with close PCP f/up in 1-2 days for recheck. Strict return precautions discussed. Will also start on debrox for cerumen impactions. F/up with PCP in 1-2 days, OTC meds for relief as needed, salt water gargles discussed. I explained the diagnosis and have given explicit precautions to return to the ER including for any other new or worsening symptoms. The patient understands and accepts the medical plan as it's been dictated and I have answered their questions. Discharge instructions concerning home care and prescriptions have been given. The patient is STABLE and is discharged to home in good condition.   I personally performed the services described in this documentation, which was scribed in my presence. The recorded information has been reviewed and is accurate.  BP 150/74 mmHg  Pulse 78  Temp(Src) 98.3 F (36.8 C) (Oral)  Resp 16  Ht _0  (1.549 m)  Wt 103.874 kg  BMI 43.29 kg/m2  SpO2 98%  Meds ordered this encounter  Medications  . phenol (CHLORASEPTIC) mouth spray 1 spray    Sig:   . amoxicillin-clavulanate (AUGMENTIN) 875-125 MG tablet    Sig: Take 1 tablet by mouth 2 (two) times daily. One po bid x 10 days    Dispense:  20 tablet    Refill:  0    Order Specific Question:  Supervising Provider    Answer:  Sabra Heck, BRIAN [[id number]]  . carbamide peroxide (DEBROX) 6.5 % otic solution    Sig: Place 5 drops into both ears 2 (two) times daily. X 5 days    Dispense:  15 mL    Refill:  1    Order Specific Question:  Supervising Provider    Answer:  Noemi Chapel [[id number]]      Laura Saleeby Camprubi-Soms, PA-C 11/06/15 2215  Laura Muskrat,  MD 11/07/15 [id number]

## 2015-11-14 DIAGNOSIS — M545 Low back pain: Secondary | ICD-10-CM | POA: Diagnosis not present

## 2015-11-14 DIAGNOSIS — M62412 Contracture of muscle, left shoulder: Secondary | ICD-10-CM | POA: Diagnosis not present

## 2015-11-14 DIAGNOSIS — M4723 Other spondylosis with radiculopathy, cervicothoracic region: Secondary | ICD-10-CM | POA: Diagnosis not present

## 2015-11-14 DIAGNOSIS — M542 Cervicalgia: Secondary | ICD-10-CM | POA: Diagnosis not present

## 2015-11-14 DIAGNOSIS — M5032 Other cervical disc degeneration, mid-cervical region, unspecified level: Secondary | ICD-10-CM | POA: Diagnosis not present

## 2015-11-14 DIAGNOSIS — M5413 Radiculopathy, cervicothoracic region: Secondary | ICD-10-CM | POA: Diagnosis not present

## 2015-11-14 DIAGNOSIS — M4305 Spondylolysis, thoracolumbar region: Secondary | ICD-10-CM | POA: Diagnosis not present

## 2015-11-26 DIAGNOSIS — R69 Illness, unspecified: Secondary | ICD-10-CM | POA: Diagnosis not present

## 2015-11-28 DIAGNOSIS — E119 Type 2 diabetes mellitus without complications: Secondary | ICD-10-CM | POA: Diagnosis not present

## 2015-12-20 DIAGNOSIS — M5417 Radiculopathy, lumbosacral region: Secondary | ICD-10-CM | POA: Diagnosis not present

## 2015-12-20 DIAGNOSIS — G894 Chronic pain syndrome: Secondary | ICD-10-CM | POA: Diagnosis not present

## 2015-12-20 DIAGNOSIS — M4317 Spondylolisthesis, lumbosacral region: Secondary | ICD-10-CM | POA: Diagnosis not present

## 2015-12-25 ENCOUNTER — Ambulatory Visit (INDEPENDENT_AMBULATORY_CARE_PROVIDER_SITE_OTHER): Payer: Self-pay | Admitting: Neurology

## 2015-12-25 ENCOUNTER — Encounter: Payer: Self-pay | Admitting: Neurology

## 2015-12-25 ENCOUNTER — Ambulatory Visit (INDEPENDENT_AMBULATORY_CARE_PROVIDER_SITE_OTHER): Payer: Medicare HMO | Admitting: Neurology

## 2015-12-25 DIAGNOSIS — M79672 Pain in left foot: Secondary | ICD-10-CM

## 2015-12-25 DIAGNOSIS — G609 Hereditary and idiopathic neuropathy, unspecified: Secondary | ICD-10-CM

## 2015-12-25 DIAGNOSIS — M79602 Pain in left arm: Secondary | ICD-10-CM | POA: Diagnosis not present
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
## 2015-12-25 NOTE — Progress Notes (Signed)
Please refer to the EMG and nerve conduction study procedure note.

## 2016-02-14 DIAGNOSIS — E785 Hyperlipidemia, unspecified: Secondary | ICD-10-CM | POA: Diagnosis not present

## 2016-02-14 DIAGNOSIS — I10 Essential (primary) hypertension: Secondary | ICD-10-CM | POA: Diagnosis not present

## 2016-02-14 DIAGNOSIS — E089 Diabetes mellitus due to underlying condition without complications: Secondary | ICD-10-CM | POA: Diagnosis not present

## 2016-03-05 DIAGNOSIS — E119 Type 2 diabetes mellitus without complications: Secondary | ICD-10-CM | POA: Diagnosis not present

## 2016-07-02 DIAGNOSIS — E119 Type 2 diabetes mellitus without complications: Secondary | ICD-10-CM | POA: Diagnosis not present

## 2016-08-14 DIAGNOSIS — M1712 Unilateral primary osteoarthritis, left knee: Secondary | ICD-10-CM | POA: Diagnosis not present

## 2016-08-14 DIAGNOSIS — M1990 Unspecified osteoarthritis, unspecified site: Secondary | ICD-10-CM | POA: Diagnosis not present

## 2016-08-14 DIAGNOSIS — M545 Low back pain: Secondary | ICD-10-CM | POA: Diagnosis not present

## 2016-08-26 DIAGNOSIS — I10 Essential (primary) hypertension: Secondary | ICD-10-CM | POA: Diagnosis not present

## 2016-08-26 DIAGNOSIS — E119 Type 2 diabetes mellitus without complications: Secondary | ICD-10-CM | POA: Diagnosis not present

## 2016-08-26 DIAGNOSIS — E785 Hyperlipidemia, unspecified: Secondary | ICD-10-CM | POA: Diagnosis not present
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
## 2016-09-18 DIAGNOSIS — M1712 Unilateral primary osteoarthritis, left knee: Secondary | ICD-10-CM | POA: Diagnosis not present

## 2016-09-18 DIAGNOSIS — M1990 Unspecified osteoarthritis, unspecified site: Secondary | ICD-10-CM | POA: Diagnosis not present

## 2016-09-18 DIAGNOSIS — M545 Low back pain: Secondary | ICD-10-CM | POA: Diagnosis not present

## 2016-09-19 DIAGNOSIS — I10 Essential (primary) hypertension: Secondary | ICD-10-CM | POA: Diagnosis not present

## 2016-09-19 DIAGNOSIS — Z0000 Encounter for general adult medical examination without abnormal findings: Secondary | ICD-10-CM | POA: Diagnosis not present

## 2016-09-19 DIAGNOSIS — Z6841 Body Mass Index (BMI) 40.0 and over, adult: Secondary | ICD-10-CM | POA: Diagnosis not present

## 2016-09-25 DIAGNOSIS — Z0000 Encounter for general adult medical examination without abnormal findings: Secondary | ICD-10-CM | POA: Diagnosis not present

## 2016-09-25 DIAGNOSIS — E089 Diabetes mellitus due to underlying condition without complications: Secondary | ICD-10-CM | POA: Diagnosis not present

## 2016-09-25 DIAGNOSIS — E119 Type 2 diabetes mellitus without complications: Secondary | ICD-10-CM | POA: Diagnosis not present

## 2016-09-25 DIAGNOSIS — I10 Essential (primary) hypertension: Secondary | ICD-10-CM | POA: Diagnosis not present

## 2016-09-25 DIAGNOSIS — R945 Abnormal results of liver function studies: Secondary | ICD-10-CM | POA: Diagnosis not present

## 2016-09-25 DIAGNOSIS — E7800 Pure hypercholesterolemia, unspecified: Secondary | ICD-10-CM | POA: Diagnosis not present

## 2016-09-25 DIAGNOSIS — E785 Hyperlipidemia, unspecified: Secondary | ICD-10-CM | POA: Diagnosis not present

## 2016-10-09 DIAGNOSIS — H52223 Regular astigmatism, bilateral: Secondary | ICD-10-CM | POA: Diagnosis not present

## 2016-10-14 DIAGNOSIS — M545 Low back pain: Secondary | ICD-10-CM | POA: Diagnosis not present

## 2016-10-15 DIAGNOSIS — M545 Low back pain: Secondary | ICD-10-CM | POA: Diagnosis not present

## 2016-10-15 DIAGNOSIS — M4316 Spondylolisthesis, lumbar region: Secondary | ICD-10-CM | POA: Diagnosis not present

## 2016-10-15 DIAGNOSIS — M2560 Stiffness of unspecified joint, not elsewhere classified: Secondary | ICD-10-CM | POA: Diagnosis not present

## 2016-10-15 DIAGNOSIS — M6281 Muscle weakness (generalized): Secondary | ICD-10-CM | POA: Diagnosis not present

## 2016-10-18 DIAGNOSIS — M545 Low back pain: Secondary | ICD-10-CM | POA: Diagnosis not present

## 2016-10-18 DIAGNOSIS — M1990 Unspecified osteoarthritis, unspecified site: Secondary | ICD-10-CM | POA: Diagnosis not present

## 2016-10-18 DIAGNOSIS — M1712 Unilateral primary osteoarthritis, left knee: Secondary | ICD-10-CM | POA: Diagnosis not present

## 2016-10-23 DIAGNOSIS — M6281 Muscle weakness (generalized): Secondary | ICD-10-CM | POA: Diagnosis not present

## 2016-10-23 DIAGNOSIS — M2560 Stiffness of unspecified joint, not elsewhere classified: Secondary | ICD-10-CM | POA: Diagnosis not present

## 2016-10-23 DIAGNOSIS — M4316 Spondylolisthesis, lumbar region: Secondary | ICD-10-CM | POA: Diagnosis not present

## 2016-10-23 DIAGNOSIS — M545 Low back pain: Secondary | ICD-10-CM | POA: Diagnosis not present

## 2016-10-24 DIAGNOSIS — M545 Low back pain: Secondary | ICD-10-CM | POA: Diagnosis not present

## 2016-10-24 DIAGNOSIS — M4316 Spondylolisthesis, lumbar region: Secondary | ICD-10-CM | POA: Diagnosis not present

## 2016-10-24 DIAGNOSIS — M48061 Spinal stenosis, lumbar region without neurogenic claudication: Secondary | ICD-10-CM | POA: Diagnosis not present

## 2016-11-03 DIAGNOSIS — M48061 Spinal stenosis, lumbar region without neurogenic claudication: Secondary | ICD-10-CM | POA: Diagnosis not present

## 2016-11-03 DIAGNOSIS — M4316 Spondylolisthesis, lumbar region: Secondary | ICD-10-CM | POA: Diagnosis not present

## 2016-11-03 DIAGNOSIS — M545 Low back pain: Secondary | ICD-10-CM | POA: Diagnosis not present

## 2016-11-03 DIAGNOSIS — M7061 Trochanteric bursitis, right hip: Secondary | ICD-10-CM | POA: Diagnosis not present

## 2016-11-10 DIAGNOSIS — M7061 Trochanteric bursitis, right hip: Secondary | ICD-10-CM | POA: Diagnosis not present

## 2016-11-17 DIAGNOSIS — M1712 Unilateral primary osteoarthritis, left knee: Secondary | ICD-10-CM | POA: Diagnosis not present

## 2016-11-17 DIAGNOSIS — M1990 Unspecified osteoarthritis, unspecified site: Secondary | ICD-10-CM | POA: Diagnosis not present

## 2016-11-17 DIAGNOSIS — M545 Low back pain: Secondary | ICD-10-CM | POA: Diagnosis not present

## 2017-01-26 DIAGNOSIS — E785 Hyperlipidemia, unspecified: Secondary | ICD-10-CM | POA: Diagnosis not present

## 2017-01-26 DIAGNOSIS — E119 Type 2 diabetes mellitus without complications: Secondary | ICD-10-CM | POA: Diagnosis not present

## 2017-01-26 DIAGNOSIS — E0800 Diabetes mellitus due to underlying condition with hyperosmolarity without nonketotic hyperglycemic-hyperosmolar coma (NKHHC): Secondary | ICD-10-CM | POA: Diagnosis not present

## 2017-01-26 DIAGNOSIS — I10 Essential (primary) hypertension: Secondary | ICD-10-CM | POA: Diagnosis not present

## 2017-02-13 ENCOUNTER — Other Ambulatory Visit: Payer: Self-pay | Admitting: Family Medicine

## 2017-02-13 DIAGNOSIS — Z1231 Encounter for screening mammogram for malignant neoplasm of breast: Secondary | ICD-10-CM

## 2017-02-13 DIAGNOSIS — M7062 Trochanteric bursitis, left hip: Secondary | ICD-10-CM | POA: Diagnosis not present

## 2017-02-27 ENCOUNTER — Ambulatory Visit
Admission: RE | Admit: 2017-02-27 | Discharge: 2017-02-27 | Disposition: A | Discharge status: Home / Self Care | Payer: Medicare HMO | Source: Ambulatory Visit | Attending: Family Medicine | Admitting: Family Medicine

## 2017-02-27 DIAGNOSIS — Z1231 Encounter for screening mammogram for malignant neoplasm of breast: Secondary | ICD-10-CM

## 2017-03-13 DIAGNOSIS — M62831 Muscle spasm of calf: Secondary | ICD-10-CM | POA: Diagnosis not present

## 2017-03-13 DIAGNOSIS — I10 Essential (primary) hypertension: Secondary | ICD-10-CM | POA: Diagnosis not present

## 2017-03-20 DIAGNOSIS — E118 Type 2 diabetes mellitus with unspecified complications: Secondary | ICD-10-CM | POA: Diagnosis not present

## 2017-03-20 DIAGNOSIS — R109 Unspecified abdominal pain: Secondary | ICD-10-CM | POA: Diagnosis not present

## 2017-03-20 DIAGNOSIS — I10 Essential (primary) hypertension: Secondary | ICD-10-CM | POA: Diagnosis not present

## 2017-03-26 DIAGNOSIS — R079 Chest pain, unspecified: Secondary | ICD-10-CM | POA: Diagnosis not present

## 2017-03-26 DIAGNOSIS — Z86718 Personal history of other venous thrombosis and embolism: Secondary | ICD-10-CM | POA: Diagnosis not present

## 2017-03-26 DIAGNOSIS — I10 Essential (primary) hypertension: Secondary | ICD-10-CM | POA: Diagnosis not present

## 2017-03-26 DIAGNOSIS — I2699 Other pulmonary embolism without acute cor pulmonale: Secondary | ICD-10-CM | POA: Diagnosis not present

## 2017-03-26 DIAGNOSIS — R0602 Shortness of breath: Secondary | ICD-10-CM | POA: Diagnosis not present

## 2017-03-26 DIAGNOSIS — I2692 Saddle embolus of pulmonary artery without acute cor pulmonale: Secondary | ICD-10-CM | POA: Diagnosis not present

## 2017-03-26 DIAGNOSIS — E118 Type 2 diabetes mellitus with unspecified complications: Secondary | ICD-10-CM | POA: Diagnosis not present

## 2017-03-26 DIAGNOSIS — E669 Obesity, unspecified: Secondary | ICD-10-CM | POA: Diagnosis not present

## 2017-03-26 DIAGNOSIS — E119 Type 2 diabetes mellitus without complications: Secondary | ICD-10-CM | POA: Diagnosis not present

## 2017-03-26 DIAGNOSIS — I2602 Saddle embolus of pulmonary artery with acute cor pulmonale: Secondary | ICD-10-CM | POA: Diagnosis not present

## 2017-03-26 DIAGNOSIS — R791 Abnormal coagulation profile: Secondary | ICD-10-CM | POA: Diagnosis not present

## 2017-03-26 DIAGNOSIS — E876 Hypokalemia: Secondary | ICD-10-CM | POA: Diagnosis not present

## 2017-03-26 DIAGNOSIS — Z6841 Body Mass Index (BMI) 40.0 and over, adult: Secondary | ICD-10-CM | POA: Diagnosis not present

## 2017-04-15 DIAGNOSIS — E118 Type 2 diabetes mellitus with unspecified complications: Secondary | ICD-10-CM | POA: Diagnosis not present

## 2017-04-15 DIAGNOSIS — G894 Chronic pain syndrome: Secondary | ICD-10-CM | POA: Diagnosis not present

## 2017-04-15 DIAGNOSIS — I10 Essential (primary) hypertension: Secondary | ICD-10-CM | POA: Diagnosis not present

## 2017-04-15 DIAGNOSIS — I2699 Other pulmonary embolism without acute cor pulmonale: Secondary | ICD-10-CM | POA: Diagnosis not present

## 2017-04-15 DIAGNOSIS — E785 Hyperlipidemia, unspecified: Secondary | ICD-10-CM | POA: Diagnosis not present

## 2017-04-20 DIAGNOSIS — M4316 Spondylolisthesis, lumbar region: Secondary | ICD-10-CM | POA: Diagnosis not present

## 2017-04-20 DIAGNOSIS — M48061 Spinal stenosis, lumbar region without neurogenic claudication: Secondary | ICD-10-CM | POA: Diagnosis not present

## 2017-04-23 DIAGNOSIS — M4800 Spinal stenosis, site unspecified: Secondary | ICD-10-CM | POA: Diagnosis not present

## 2017-04-23 DIAGNOSIS — E119 Type 2 diabetes mellitus without complications: Secondary | ICD-10-CM | POA: Diagnosis not present

## 2017-04-23 DIAGNOSIS — E785 Hyperlipidemia, unspecified: Secondary | ICD-10-CM | POA: Diagnosis not present

## 2017-04-23 DIAGNOSIS — Z9181 History of falling: Secondary | ICD-10-CM | POA: Diagnosis not present

## 2017-04-23 DIAGNOSIS — Z6841 Body Mass Index (BMI) 40.0 and over, adult: Secondary | ICD-10-CM | POA: Diagnosis not present

## 2017-04-23 DIAGNOSIS — Z7984 Long term (current) use of oral hypoglycemic drugs: Secondary | ICD-10-CM | POA: Diagnosis not present

## 2017-04-23 DIAGNOSIS — Z0000 Encounter for general adult medical examination without abnormal findings: Secondary | ICD-10-CM | POA: Diagnosis not present

## 2017-04-23 DIAGNOSIS — I10 Essential (primary) hypertension: Secondary | ICD-10-CM | POA: Diagnosis not present

## 2017-04-23 DIAGNOSIS — E876 Hypokalemia: Secondary | ICD-10-CM | POA: Diagnosis not present

## 2017-04-29 DIAGNOSIS — I2699 Other pulmonary embolism without acute cor pulmonale: Secondary | ICD-10-CM | POA: Diagnosis not present

## 2017-04-29 DIAGNOSIS — E118 Type 2 diabetes mellitus with unspecified complications: Secondary | ICD-10-CM | POA: Diagnosis not present

## 2017-04-29 DIAGNOSIS — I10 Essential (primary) hypertension: Secondary | ICD-10-CM | POA: Diagnosis not present

## 2017-06-04 DIAGNOSIS — I10 Essential (primary) hypertension: Secondary | ICD-10-CM | POA: Diagnosis not present

## 2017-06-04 DIAGNOSIS — I2699 Other pulmonary embolism without acute cor pulmonale: Secondary | ICD-10-CM | POA: Diagnosis not present

## 2017-06-04 DIAGNOSIS — E118 Type 2 diabetes mellitus with unspecified complications: Secondary | ICD-10-CM | POA: Diagnosis not present

## 2017-06-04 DIAGNOSIS — Z0000 Encounter for general adult medical examination without abnormal findings: Secondary | ICD-10-CM | POA: Diagnosis not present

## 2017-06-24 DIAGNOSIS — M48061 Spinal stenosis, lumbar region without neurogenic claudication: Secondary | ICD-10-CM | POA: Diagnosis not present

## 2017-08-06 DIAGNOSIS — I10 Essential (primary) hypertension: Secondary | ICD-10-CM | POA: Diagnosis not present

## 2017-08-06 DIAGNOSIS — I2699 Other pulmonary embolism without acute cor pulmonale: Secondary | ICD-10-CM | POA: Diagnosis not present

## 2017-08-06 DIAGNOSIS — E785 Hyperlipidemia, unspecified: Secondary | ICD-10-CM | POA: Diagnosis not present

## 2017-08-06 DIAGNOSIS — E118 Type 2 diabetes mellitus with unspecified complications: Secondary | ICD-10-CM | POA: Diagnosis not present

## 2017-08-14 ENCOUNTER — Other Ambulatory Visit: Payer: Self-pay | Admitting: Physical Medicine and Rehabilitation

## 2017-08-14 DIAGNOSIS — M545 Low back pain: Principal | ICD-10-CM

## 2017-08-14 DIAGNOSIS — G8929 Other chronic pain: Secondary | ICD-10-CM

## 2017-08-24 ENCOUNTER — Ambulatory Visit
Admission: RE | Admit: 2017-08-24 | Discharge: 2017-08-24 | Disposition: A | Discharge status: Home / Self Care | Payer: Medicare HMO | Source: Ambulatory Visit | Attending: Physical Medicine and Rehabilitation | Admitting: Physical Medicine and Rehabilitation

## 2017-08-24 DIAGNOSIS — M545 Low back pain: Principal | ICD-10-CM

## 2017-08-24 DIAGNOSIS — M47817 Spondylosis without myelopathy or radiculopathy, lumbosacral region: Secondary | ICD-10-CM | POA: Diagnosis not present

## 2017-08-24 DIAGNOSIS — G8929 Other chronic pain: Secondary | ICD-10-CM

## 2017-08-24 MED ORDER — METHYLPREDNISOLONE ACETATE 40 MG/ML INJ SUSP (RADIOLOG
120.0000 mg | Freq: Once | INTRAMUSCULAR | Status: AC
  Administered 2017-08-24: 120 mg via EPIDURAL

## 2017-08-24 MED ORDER — IOPAMIDOL (ISOVUE-M 200) INJECTION 41%
1.0000 mL | Freq: Once | INTRAMUSCULAR | Status: AC
  Administered 2017-08-24: 1 mL via EPIDURAL

## 2017-08-24 NOTE — Discharge Instructions (Signed)

## 2017-08-25 ENCOUNTER — Encounter: Payer: Self-pay | Admitting: Hematology & Oncology

## 2017-09-04 DIAGNOSIS — M545 Low back pain: Secondary | ICD-10-CM | POA: Diagnosis not present

## 2017-09-15 ENCOUNTER — Other Ambulatory Visit: Payer: Medicare HMO

## 2017-09-15 ENCOUNTER — Ambulatory Visit: Payer: Medicare HMO | Admitting: Family

## 2017-09-17 ENCOUNTER — Telehealth: Payer: Self-pay | Admitting: Hematology & Oncology

## 2017-09-17 NOTE — Telephone Encounter (Signed)
lvm to inform pt of r/s new patient appt 10/07/17 at 1045 am

## 2017-09-22 ENCOUNTER — Other Ambulatory Visit: Payer: Self-pay | Admitting: Sports Medicine

## 2017-09-22 DIAGNOSIS — M545 Low back pain: Principal | ICD-10-CM

## 2017-09-22 DIAGNOSIS — G8929 Other chronic pain: Secondary | ICD-10-CM

## 2017-10-07 ENCOUNTER — Encounter: Payer: Self-pay | Admitting: Family

## 2017-10-07 ENCOUNTER — Inpatient Hospital Stay: Payer: Medicare HMO | Attending: Family | Admitting: Family

## 2017-10-07 ENCOUNTER — Other Ambulatory Visit: Payer: Self-pay

## 2017-10-07 ENCOUNTER — Other Ambulatory Visit: Payer: Self-pay | Admitting: Family

## 2017-10-07 ENCOUNTER — Other Ambulatory Visit: Payer: Medicare HMO

## 2017-10-07 VITALS — BP 153/70 | HR 57 | Temp 98.0°F | Resp 19 | Wt 246.1 lb

## 2017-10-07 DIAGNOSIS — Z86718 Personal history of other venous thrombosis and embolism: Secondary | ICD-10-CM

## 2017-10-07 DIAGNOSIS — D6859 Other primary thrombophilia: Secondary | ICD-10-CM | POA: Diagnosis not present

## 2017-10-07 DIAGNOSIS — Z86711 Personal history of pulmonary embolism: Secondary | ICD-10-CM | POA: Diagnosis not present

## 2017-10-07 DIAGNOSIS — Z7901 Long term (current) use of anticoagulants: Secondary | ICD-10-CM

## 2017-10-07 NOTE — Progress Notes (Signed)
Hematology/Oncology Consultation   Name: KATERIA CUTRONA      MRN: [id number]    Location: Room/bed info not found  Date: 10/07/2017 Time:11:39 AM   REFERRING PHYSICIAN: Self  REASON FOR CONSULT: History of PE and DVT   DIAGNOSIS:  Recurrent pulmonary embolism-possible Protein C and Protein S deficiency  HISTORY OF PRESENT ILLNESS: Ms. Osorto is a very pleasant 57 yo African American female with history of idiopathic DVT of the left lower extremity diagnosed in 2010. She was treated with Heparin and Lovenox and then sent home on Coumadin for 6 months. She was found to have low protein C and Protein S.  In June of this year, she flew to [place] with her daughter and grandchildren for vacation. 3 days later she developed SOB and went to the ED. She was diagnosed with bilateral PE's at that time and started Eliquis.  She has done well with Eliquis and has had no issue with bleeding, no bruising or petechiae.   She has no family history of thrombus that she knows of. She has no history of stroke or MI.  She had 2 children but unfortunately her son passed away. She has history of 2 miscarriages within the first 8 weeks.  She is asymptomatic and has no complaints at this time.  No fever, chills, n/v, cough, rash, dizziness, SOB, chest pain, palpitations, abdominal pain or changes in bowel or bladder habits.  No swelling, tenderness, numbness or tingling in her extremities. No c/o pain at that time.  She has a good appetite and is staying well hydrated. Her weight is stable.  She is "borderline diabetic" and on Metformin daily. She states that her blood sugars are well controlled.  She is not a smoker and does not drink alcohol. She has never been on hormone replacement therapy.  She stays quite active walking and is in a bowling league.  Mammogram in May was negative.  She states that she was unable to complete her colonoscopy and she is hoping to do the Cologard.  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 She is originally from [facility] [place] and moved here 10 years ago with her daughter when she went to college.    ROS: All other 10 point review of systems is negative.   PAST MEDICAL HISTORY:   Past Medical History:  Diagnosis Date  . Diabetes mellitus without complication ([place])   . Hypertension   . Obesity     ALLERGIES: No Known Allergies    MEDICATIONS:  Current Outpatient Medications on File Prior to Visit  Medication Sig Dispense Refill  . amLODipine (NORVASC) 5 MG tablet Take 5 mg by mouth daily.  4  . Blood Glucose Monitoring Suppl (ONE TOUCH ULTRA MINI) W/DEVICE KIT     . carbamide peroxide (DEBROX) 6.5 % otic solution Place 5 drops into both ears 2 (two) times daily. X 5 days 15 mL 1  . hydrochlorothiazide (HYDRODIURIL) 25 MG tablet Take 25 mg by mouth daily.    Marland Kitchen HYDROcodone-acetaminophen (NORCO/VICODIN) 5-325 MG per tablet Take 1-2 tablets by mouth every 4 (four) hours as needed. 20 tablet 0  . ibuprofen (ADVIL,MOTRIN) 800 MG tablet Take 1 tablet (800 mg total) by mouth 3 (three) times daily. 21 tablet 0  . losartan (COZAAR) 100 MG tablet Take 100 mg by mouth daily.    . metFORMIN (GLUCOPHAGE) 500 MG tablet Take 500 mg by mouth daily.    . ONE TOUCH ULTRA TEST  test strip     . potassium chloride (MICRO-K) 10 MEQ CR capsule Take 10 mEq by mouth daily.  4   No current facility-administered medications on file prior to visit.      PAST SURGICAL HISTORY Past Surgical History:  Procedure Laterality Date  . ABDOMINAL HYSTERECTOMY  2010    FAMILY HISTORY: Family History  Problem Relation Age of Onset  . Breast cancer Maternal Aunt   . Breast cancer Cousin   . Neuropathy Neg Hx     SOCIAL HISTORY:  reports that she quit smoking about 23 years ago. She does not have any smokeless tobacco history on file. She reports that she does not drink alcohol or use drugs.  PERFORMANCE STATUS: The patient's performance status is 0 -  Asymptomatic  PHYSICAL EXAM: Most Recent Vital Signs: Blood pressure (!) 153/70, pulse (!) 57, temperature 98 F (36.7 C), temperature source Oral, resp. rate 19, weight 246 lb 1.9 oz (111.6 kg), SpO2 99 %. BP (!) 153/70 (BP Location: Right Arm, Patient Position: Sitting)   Pulse (!) 57   Temp 98 F (36.7 C) (Oral)   Resp 19   Wt 246 lb 1.9 oz (111.6 kg)   SpO2 99%   BMI 46.50 kg/m   General Appearance:    Alert, cooperative, no distress, appears stated age  Head:    Normocephalic, without obvious abnormality, atraumatic  Eyes:    PERRL, conjunctiva/corneas clear, EOM's intact, fundi    benign, both eyes        Throat:   Lips, mucosa, and tongue normal; teeth and gums normal  Neck:   Supple, symmetrical, trachea midline, no adenopathy;    thyroid:  no enlargement/tenderness/nodules; no carotid   bruit or JVD  Back:     Symmetric, no curvature, ROM normal, no CVA tenderness  Lungs:     Clear to auscultation bilaterally, respirations unlabored  Chest Wall:    No tenderness or deformity   Heart:    Regular rate and rhythm, S1 and S2 normal, no murmur, rub   or gallop     Abdomen:     Soft, non-tender, bowel sounds active all four quadrants,    no masses, no organomegaly        Extremities:   Extremities normal, atraumatic, no cyanosis or edema  Pulses:   2+ and symmetric all extremities  Skin:   Skin color, texture, turgor normal, no rashes or lesions  Lymph nodes:   Cervical, supraclavicular, and axillary nodes normal  Neurologic:   CNII-XII intact, normal strength, sensation and reflexes    throughout    LABORATORY DATA:  No results found for this or any previous visit (from the past 48 hour(s)).    RADIOGRAPHY: No results found.     PATHOLOGY: None  ASSESSMENT/PLAN: Ms. Sherburne is a very pleasant 57 yo [place] American female with history of DVT of the left lower extremity in 2010 and bilateral PE in June 2018. She has a protein C and protein S deficiency.  She is  doing well on Eliquis and tolerating this nicely. No complaints at this time.  We will plan to repeat another CT angio and [place] of both lower extremities in a few weeks.  We will plan to see her back in another month for follow-up and lab. We will discuss possibly putting her on a maintenance dose of Eliquis (2.5 mg PO BID) at that time.   All questions were answered and she is in agreement with the plan.  She will contact our office with any  questions or concerns. We can certainly see her much sooner if necessary.  She was discussed with and also seen by Dr. Marin Olp and he is in agreement with the aforementioned.   Laverna Peace     Addendum: I saw and examined the patient with Judson Roch.  I agree with her above assessment.  It is not clear at all whether or not there is truly a Protein C and Protein S deficiency.  I would think having both in one person, particularly African-American would be highly on usual.  As such, I have to suspect that these low levels are because she was on Coumadin.  We are repeating her hypercoagulable studies.  I told her that I really thought that she was going to need long-term anticoagulation.  Given that she has had recurrence of her thromboembolic disease, makes me think that if she goes off anticoagulation therapy, then she will develop another blood clot.  I would prefer if this does not happen.  I also feel that we have to repeat her CT angiogram to see that the thrombus has resolved.  I told her that if she needs to be off her Eliquis for her epidural injections, she can come off 2 days before and then go back on a day afterwards.  I think this would be safe for her.  It is possible that we might consider her for low-dose maintenance therapy.  If she truly does have a thrombophilic condition, then she might need an benefit from full dose anticoagulation long-term.  It is unusual for a thrombophilic condition, outside of a lupus anticoagulant, to show up  in a minority ethnic group.  Hopefully, we got enough blood to run our thrombophilic studies.  I will plan to get her back in about a month or so.  We spent about 40 minutes with her.  We answered all of her questions.  We reviewed her lab work.  Lattie Haw, MD

## 2017-10-08 ENCOUNTER — Ambulatory Visit
Admission: RE | Admit: 2017-10-08 | Discharge: 2017-10-08 | Disposition: A | Discharge status: Home / Self Care | Payer: Medicare HMO | Source: Ambulatory Visit | Attending: Sports Medicine | Admitting: Sports Medicine

## 2017-10-08 DIAGNOSIS — M545 Low back pain: Principal | ICD-10-CM

## 2017-10-08 DIAGNOSIS — M47817 Spondylosis without myelopathy or radiculopathy, lumbosacral region: Secondary | ICD-10-CM | POA: Diagnosis not present

## 2017-10-08 DIAGNOSIS — G8929 Other chronic pain: Secondary | ICD-10-CM

## 2017-10-08 LAB — PROTEIN C, TOTAL: PROTEIN C ANTIGEN: 65 % (ref 60–150)

## 2017-10-08 MED ORDER — IOPAMIDOL (ISOVUE-M 200) INJECTION 41%
1.0000 mL | Freq: Once | INTRAMUSCULAR | Status: AC
  Administered 2017-10-08: 1 mL via EPIDURAL

## 2017-10-08 MED ORDER — METHYLPREDNISOLONE ACETATE 40 MG/ML INJ SUSP (RADIOLOG
120.0000 mg | Freq: Once | INTRAMUSCULAR | Status: AC
  Administered 2017-10-08: 120 mg via EPIDURAL

## 2017-10-08 NOTE — Discharge Instructions (Signed)

## 2017-10-13 LAB — PROTEIN C ACTIVITY: PROTEIN C ACTIVITY: 69 % — AB (ref 73–180)

## 2017-10-13 LAB — PROTEIN S, TOTAL: Protein S, Total: 60 % (ref 60–150)

## 2017-10-13 LAB — PROTEIN S ACTIVITY: Protein S-Functional: 40 % — ABNORMAL LOW (ref 63–140)

## 2017-10-26 DIAGNOSIS — H52223 Regular astigmatism, bilateral: Secondary | ICD-10-CM | POA: Diagnosis not present

## 2017-11-04 ENCOUNTER — Ambulatory Visit (HOSPITAL_BASED_OUTPATIENT_CLINIC_OR_DEPARTMENT_OTHER)
Admission: RE | Admit: 2017-11-04 | Discharge: 2017-11-04 | Disposition: A | Discharge status: Home / Self Care | Payer: Medicare HMO | Source: Ambulatory Visit | Attending: Family | Admitting: Family

## 2017-11-04 ENCOUNTER — Ambulatory Visit (HOSPITAL_BASED_OUTPATIENT_CLINIC_OR_DEPARTMENT_OTHER): Payer: Medicare HMO

## 2017-11-04 ENCOUNTER — Telehealth: Payer: Self-pay | Admitting: *Deleted

## 2017-11-04 DIAGNOSIS — Z86711 Personal history of pulmonary embolism: Secondary | ICD-10-CM | POA: Insufficient documentation

## 2017-11-04 DIAGNOSIS — Z86718 Personal history of other venous thrombosis and embolism: Secondary | ICD-10-CM | POA: Insufficient documentation

## 2017-11-04 NOTE — Telephone Encounter (Addendum)
Patient is aware of results  ----- Message from Eliezer Bottom, NP sent at 11/04/2017  3:19 PM EST ----- No evidence of DVT!!! Thank you!  Laura Campbell  ----- Message ----- From: Buel Ream, Rad Results In Sent: 11/04/2017   2:30 PM To: Eliezer Bottom, NP

## 2017-11-06 ENCOUNTER — Inpatient Hospital Stay: Payer: Medicare HMO | Attending: Family | Admitting: Family

## 2017-11-06 ENCOUNTER — Encounter: Payer: Self-pay | Admitting: Family

## 2017-11-06 ENCOUNTER — Other Ambulatory Visit: Payer: Self-pay

## 2017-11-06 ENCOUNTER — Inpatient Hospital Stay: Payer: Medicare HMO

## 2017-11-06 VITALS — BP 120/84 | HR 75 | Temp 98.1°F | Wt 247.8 lb

## 2017-11-06 DIAGNOSIS — Z86718 Personal history of other venous thrombosis and embolism: Secondary | ICD-10-CM | POA: Diagnosis not present

## 2017-11-06 DIAGNOSIS — I2692 Saddle embolus of pulmonary artery without acute cor pulmonale: Secondary | ICD-10-CM

## 2017-11-06 DIAGNOSIS — Z7901 Long term (current) use of anticoagulants: Secondary | ICD-10-CM | POA: Insufficient documentation

## 2017-11-06 DIAGNOSIS — D6859 Other primary thrombophilia: Secondary | ICD-10-CM | POA: Insufficient documentation

## 2017-11-06 DIAGNOSIS — Z86711 Personal history of pulmonary embolism: Secondary | ICD-10-CM

## 2017-11-06 LAB — CBC WITH DIFFERENTIAL (CANCER CENTER ONLY)
Basophils Absolute: 0 10*3/uL (ref 0.0–0.1)
Basophils Relative: 0 %
EOS ABS: 0.3 10*3/uL (ref 0.0–0.5)
EOS PCT: 4 %
HCT: 42.7 % (ref 34.8–46.6)
HEMOGLOBIN: 13.7 g/dL (ref 11.6–15.9)
LYMPHS ABS: 2.1 10*3/uL (ref 0.9–3.3)
Lymphocytes Relative: 29 %
MCH: 25.8 pg — AB (ref 26.0–34.0)
MCHC: 32.1 g/dL (ref 32.0–36.0)
MCV: 80.6 fL — ABNORMAL LOW (ref 81.0–101.0)
MONOS PCT: 6 %
Monocytes Absolute: 0.4 10*3/uL (ref 0.1–0.9)
Neutro Abs: 4.5 10*3/uL (ref 1.5–6.5)
Neutrophils Relative %: 61 %
PLATELETS: 228 10*3/uL (ref 145–400)
RBC: 5.3 MIL/uL (ref 3.70–5.32)
RDW: 15.3 % (ref 11.1–15.7)
WBC Count: 7.4 10*3/uL (ref 3.9–10.0)

## 2017-11-06 LAB — CMP (CANCER CENTER ONLY)
ALT: 20 U/L (ref 0–55)
ANION GAP: 12 — AB (ref 3–11)
AST: 21 U/L (ref 5–34)
Albumin: 3.6 g/dL (ref 3.5–5.0)
Alkaline Phosphatase: 94 U/L (ref 40–150)
BUN: 24 mg/dL (ref 7–26)
CALCIUM: 10.4 mg/dL (ref 8.4–10.4)
CO2: 23 mmol/L (ref 22–29)
Chloride: 108 mmol/L (ref 98–109)
Creatinine: 0.91 mg/dL (ref 0.60–1.10)
Glucose, Bld: 110 mg/dL (ref 70–140)
Potassium: 3.8 mmol/L (ref 3.5–5.1)
SODIUM: 143 mmol/L (ref 136–145)
Total Bilirubin: 0.3 mg/dL (ref 0.2–1.2)
Total Protein: 8 g/dL (ref 6.4–8.3)

## 2017-11-06 NOTE — Progress Notes (Signed)
Hematology and Oncology Follow Up Visit  KAYAH HECKER [id number] Dec 17, 1960 57 y.o. 11/06/2017   Principle Diagnosis:  History of recurrent PE/DVT Protein C and S deficiency  Current Therapy:   Eliquis 5 mg PO BID - started June 2018   Interim History:  Ms. Buick is here today with her grandson for follow-up. She is doing well and has no complaints at this time.  We still have not been able to get insurance to approve a follow-up CT angio. It would be nice to have a follow-up scan since she has been on treatment.  She is doing well on Eliquis and taking as prescribed. No episodes of bleeding, no bruising or petechiae.  No lymphadenopathy found on exam.  No fever, chills, n/v, cough, rash, dizziness, SOB, chest pain, palpitations, abdominal pain or changes in bowel or bladder habits.  No swelling, tenderness, numbness or tingling in her extremities. No c/o pain.  She has maintained a good appetite and is staying well hydrated. Her weight is stable.   ECOG Performance Status: 0 - Asymptomatic  Medications:  Allergies as of 11/06/2017   No Known Allergies     Medication List        Accurate as of 11/06/17  2:16 PM. Always use your most recent med list.          ELIQUIS 5 MG Tabs tablet Generic drug:  apixaban Take 5 mg by mouth 2 (two) times daily.   gabapentin 300 MG capsule Commonly known as:  NEURONTIN Take 300 mg by mouth 3 (three) times daily.   metFORMIN 500 MG tablet Commonly known as:  GLUCOPHAGE Take 500 mg by mouth daily.   ONE TOUCH ULTRA MINI w/Device Kit   ONE TOUCH ULTRA TEST test strip Generic drug:  glucose blood   potassium chloride 10 MEQ CR capsule Commonly known as:  MICRO-K Take 10 mEq by mouth daily.   rosuvastatin 5 MG tablet Commonly known as:  CRESTOR Take 5 mg by mouth daily.   telmisartan-hydrochlorothiazide 80-25 MG tablet Commonly known as:  MICARDIS HCT Take 1 tablet by mouth every morning.       Allergies: No Known  Allergies  Past Medical History, Surgical history, Social history, and Family History were reviewed and updated.  Review of Systems: All other 10 point review of systems is negative.   Physical Exam:  weight is 247 lb 12.8 oz (112.4 kg). Her oral temperature is 98.1 F (36.7 C). Her blood pressure is 120/84 and her pulse is 75.   Wt Readings from Last 3 Encounters:  11/06/17 247 lb 12.8 oz (112.4 kg)  10/07/17 246 lb 1.9 oz (111.6 kg)  11/06/15 229 lb (103.9 kg)    Ocular: Sclerae unicteric, pupils equal, round and reactive to light Ear-nose-throat: Oropharynx clear, dentition fair Lymphatic: No cervical, supraclavicular or axillary adenopathy Lungs no rales or rhonchi, good excursion bilaterally Heart regular rate and rhythm, no murmur appreciated Abd soft, nontender, positive bowel sounds, no liver or spleen tip palpated on exam, no fluid wave  MSK no focal spinal tenderness, no joint edema Neuro: non-focal, well-oriented, appropriate affect Breasts: Deferred   Lab Results  Component Value Date   WBC 7.4 11/06/2017   HGB 13.9 05/08/2013   HCT 42.7 11/06/2017   MCV 80.6 (L) 11/06/2017   PLT 228 11/06/2017   Lab Results  Component Value Date   FERRITIN 12 05/26/2009   IRON 19 (L) 05/26/2009   TIBC 354 05/26/2009   UIBC 335 05/26/2009  IRONPCTSAT 5 (L) 05/26/2009   Lab Results  Component Value Date   RETICCTPCT 1.3 05/26/2009   RBC 5.30 11/06/2017   No results found for: KPAFRELGTCHN, LAMBDASER, KAPLAMBRATIO No results found for: IGGSERUM, IGA, IGMSERUM No results found for: Ronnald Ramp, A1GS, A2GS, Tillman Sers, SPEI   Chemistry      Component Value Date/Time   NA 139 05/08/2013 1955   K 3.6 05/08/2013 1955   CL 106 05/08/2013 1955   CO2 28 02/19/2010 1011   BUN 12 05/08/2013 1955   CREATININE 0.90 05/08/2013 1955      Component Value Date/Time   CALCIUM 9.2 02/19/2010 1011   ALKPHOS 57 02/19/2010 1011   AST 19 02/19/2010  1011   ALT 13 02/19/2010 1011   BILITOT 0.2 (L) 02/19/2010 1011      Impression and Plan: Ms. Wittner is a very pleasant 57 yo African American female with history of DVT and recurrent PE. She has both protein S and protein C deficiency.  She continues to do well on Eliquis and has no complaints at this time.  We will plan to see her again in 3 months for follow-up.  She will contact our office with any questions or concerns. We can certainly see her sooner if need be.   Laverna Peace, NP 2/1/20192:16 PM

## 2017-11-09 LAB — DRVVT MIX: dRVVT Mix: 50.1 s — ABNORMAL HIGH (ref 0.0–47.0)

## 2017-11-09 LAB — LUPUS ANTICOAGULANT PANEL
DRVVT: 64 s — ABNORMAL HIGH (ref 0.0–47.0)
PTT LA: 34.8 s (ref 0.0–51.9)

## 2017-11-09 LAB — DRVVT CONFIRM: DRVVT CONFIRM: 0.9 ratio (ref 0.8–1.2)

## 2017-11-11 LAB — CARDIOLIPIN ANTIBODIES, IGG, IGM, IGA
Anticardiolipin IgG: 30 GPL U/mL — ABNORMAL HIGH (ref 0–14)
Anticardiolipin IgM: <9 MPL U/mL (ref 0–12)

## 2017-11-24 ENCOUNTER — Ambulatory Visit (HOSPITAL_BASED_OUTPATIENT_CLINIC_OR_DEPARTMENT_OTHER)
Admission: RE | Admit: 2017-11-24 | Discharge: 2017-11-24 | Disposition: A | Discharge status: Home / Self Care | Payer: Medicare HMO | Source: Ambulatory Visit | Attending: Family | Admitting: Family

## 2017-11-24 DIAGNOSIS — I289 Disease of pulmonary vessels, unspecified: Secondary | ICD-10-CM | POA: Diagnosis not present

## 2017-11-24 DIAGNOSIS — Z86711 Personal history of pulmonary embolism: Secondary | ICD-10-CM | POA: Diagnosis not present

## 2017-11-24 DIAGNOSIS — Z86718 Personal history of other venous thrombosis and embolism: Secondary | ICD-10-CM | POA: Insufficient documentation

## 2017-11-24 DIAGNOSIS — I517 Cardiomegaly: Secondary | ICD-10-CM | POA: Diagnosis not present

## 2017-11-24 DIAGNOSIS — I2699 Other pulmonary embolism without acute cor pulmonale: Secondary | ICD-10-CM | POA: Diagnosis not present

## 2017-11-24 DIAGNOSIS — I7789 Other specified disorders of arteries and arterioles: Secondary | ICD-10-CM | POA: Diagnosis not present

## 2017-11-24 MED ORDER — IOPAMIDOL (ISOVUE-370) INJECTION 76%
100.0000 mL | Freq: Once | INTRAVENOUS | Status: AC | PRN
  Administered 2017-11-24: 100 mL via INTRAVENOUS

## 2017-11-25 ENCOUNTER — Inpatient Hospital Stay: Payer: Medicare HMO

## 2017-11-26 ENCOUNTER — Ambulatory Visit: Payer: Medicare HMO

## 2017-11-30 ENCOUNTER — Telehealth: Payer: Self-pay | Admitting: *Deleted

## 2017-11-30 NOTE — Telephone Encounter (Addendum)
Patient is aware of results  ----- Message from Volanda Napoleon, MD sent at 11/30/2017  1:36 PM EST ----- Call - NO blood clot in the lung!!  Laurey Arrow

## 2017-12-04 DIAGNOSIS — E119 Type 2 diabetes mellitus without complications: Secondary | ICD-10-CM | POA: Diagnosis not present

## 2017-12-04 DIAGNOSIS — E039 Hypothyroidism, unspecified: Secondary | ICD-10-CM | POA: Diagnosis not present

## 2017-12-04 DIAGNOSIS — D6859 Other primary thrombophilia: Secondary | ICD-10-CM | POA: Diagnosis not present

## 2017-12-04 DIAGNOSIS — I2692 Saddle embolus of pulmonary artery without acute cor pulmonale: Secondary | ICD-10-CM | POA: Diagnosis not present

## 2017-12-04 DIAGNOSIS — Z86718 Personal history of other venous thrombosis and embolism: Secondary | ICD-10-CM | POA: Diagnosis not present

## 2017-12-04 DIAGNOSIS — R635 Abnormal weight gain: Secondary | ICD-10-CM | POA: Diagnosis not present

## 2017-12-04 DIAGNOSIS — E785 Hyperlipidemia, unspecified: Secondary | ICD-10-CM | POA: Diagnosis not present

## 2017-12-04 DIAGNOSIS — E118 Type 2 diabetes mellitus with unspecified complications: Secondary | ICD-10-CM | POA: Diagnosis not present

## 2017-12-23 DIAGNOSIS — M7062 Trochanteric bursitis, left hip: Secondary | ICD-10-CM | POA: Diagnosis not present

## 2018-01-06 DIAGNOSIS — M7061 Trochanteric bursitis, right hip: Secondary | ICD-10-CM | POA: Diagnosis not present

## 2018-02-05 ENCOUNTER — Other Ambulatory Visit: Payer: Self-pay

## 2018-02-05 ENCOUNTER — Inpatient Hospital Stay: Payer: Medicare HMO | Attending: Family | Admitting: Family

## 2018-02-05 ENCOUNTER — Inpatient Hospital Stay: Payer: Medicare HMO

## 2018-02-05 ENCOUNTER — Encounter: Payer: Self-pay | Admitting: Family

## 2018-02-05 VITALS — BP 134/69 | HR 61 | Temp 98.0°F | Resp 18 | Wt 244.0 lb

## 2018-02-05 DIAGNOSIS — Z86718 Personal history of other venous thrombosis and embolism: Secondary | ICD-10-CM | POA: Insufficient documentation

## 2018-02-05 DIAGNOSIS — D6859 Other primary thrombophilia: Secondary | ICD-10-CM

## 2018-02-05 DIAGNOSIS — Z7901 Long term (current) use of anticoagulants: Secondary | ICD-10-CM | POA: Diagnosis not present

## 2018-02-05 DIAGNOSIS — Z86711 Personal history of pulmonary embolism: Secondary | ICD-10-CM

## 2018-02-05 DIAGNOSIS — I2692 Saddle embolus of pulmonary artery without acute cor pulmonale: Secondary | ICD-10-CM

## 2018-02-05 LAB — CBC WITH DIFFERENTIAL (CANCER CENTER ONLY)
BASOS ABS: 0 10*3/uL (ref 0.0–0.1)
Basophils Relative: 0 %
EOS ABS: 0.2 10*3/uL (ref 0.0–0.5)
Eosinophils Relative: 3 %
HCT: 42.8 % (ref 34.8–46.6)
HEMOGLOBIN: 13.7 g/dL (ref 11.6–15.9)
LYMPHS ABS: 1.9 10*3/uL (ref 0.9–3.3)
LYMPHS PCT: 26 %
MCH: 26.2 pg (ref 26.0–34.0)
MCHC: 32 g/dL (ref 32.0–36.0)
MCV: 82 fL (ref 81.0–101.0)
Monocytes Absolute: 0.5 10*3/uL (ref 0.1–0.9)
Monocytes Relative: 7 %
NEUTROS PCT: 64 %
Neutro Abs: 4.7 10*3/uL (ref 1.5–6.5)
PLATELETS: 235 10*3/uL (ref 145–400)
RBC: 5.22 MIL/uL (ref 3.70–5.32)
RDW: 14.9 % (ref 11.1–15.7)
WBC Count: 7.4 10*3/uL (ref 3.9–10.0)

## 2018-02-05 LAB — CMP (CANCER CENTER ONLY)
ALK PHOS: 81 U/L (ref 40–150)
ALT: 23 U/L (ref 0–55)
AST: 20 U/L (ref 5–34)
Albumin: 3.6 g/dL (ref 3.5–5.0)
Anion gap: 8 (ref 3–11)
BUN: 25 mg/dL (ref 7–26)
CALCIUM: 10.5 mg/dL — AB (ref 8.4–10.4)
CHLORIDE: 107 mmol/L (ref 98–109)
CO2: 26 mmol/L (ref 22–29)
CREATININE: 0.94 mg/dL (ref 0.60–1.10)
GFR, Est AFR Am: >60 mL/min (ref >60)
GFR, Estimated: >60 mL/min (ref >60)
GLUCOSE: 133 mg/dL (ref 70–140)
Potassium: 4.1 mmol/L (ref 3.5–5.1)
SODIUM: 141 mmol/L (ref 136–145)
Total Bilirubin: 0.3 mg/dL (ref 0.2–1.2)
Total Protein: 7.4 g/dL (ref 6.4–8.3)

## 2018-02-05 NOTE — Progress Notes (Signed)
Hematology and Oncology Follow Up Visit  Laura Campbell [id number] 07/20/1961 57 y.o. 02/05/2018   Principle Diagnosis:  History of recurrent PE/DVT Protein C and S deficiency  Current Therapy:   Eliquis 5 mg PO BID - started June 2018   Interim History:  Laura Campbell is here today for follow-up. She is doing well and staying busy taking care of her grandson. She is exercising regularly and states that she has lost 6 lbs.  She has had no issues on Eliquis and is taking as prescribed. No episodes of bleeding, no bruising or petechiae.  No swelling, tenderness, numbness or tingling in her extremities. No c/o pain. No fever, chills, n/v, cough, rash, dizziness, SOB, chest pain, palpitations, abdominal pain or changes in bowel or bladder habits. No lymphadenopathy noted on exam.  She has a good appetite and is staying well hydrated daily. Her weight is stable.   ECOG Performance Status: 0 - Asymptomatic  Medications:  Allergies as of 02/05/2018   No Known Allergies     Medication List        Accurate as of 02/05/18  1:27 PM. Always use your most recent med list.          ELIQUIS 5 MG Tabs tablet Generic drug:  apixaban Take 5 mg by mouth 2 (two) times daily.   gabapentin 300 MG capsule Commonly known as:  NEURONTIN Take 300 mg by mouth 3 (three) times daily.   metFORMIN 500 MG tablet Commonly known as:  GLUCOPHAGE Take 500 mg by mouth daily.   ONE TOUCH ULTRA MINI w/Device Kit   ONE TOUCH ULTRA TEST test strip Generic drug:  glucose blood   potassium chloride 10 MEQ CR capsule Commonly known as:  MICRO-K Take 10 mEq by mouth daily.   rosuvastatin 5 MG tablet Commonly known as:  CRESTOR Take 5 mg by mouth daily.   telmisartan-hydrochlorothiazide 80-25 MG tablet Commonly known as:  MICARDIS HCT Take 1 tablet by mouth every morning.       Allergies: No Known Allergies  Past Medical History, Surgical history, Social history, and Family History were reviewed  and updated.  Review of Systems: All other 10 point review of systems is negative.   Physical Exam:  vitals were not taken for this visit.   Wt Readings from Last 3 Encounters:  11/06/17 247 lb 12.8 oz (112.4 kg)  10/07/17 246 lb 1.9 oz (111.6 kg)  11/06/15 229 lb (103.9 kg)    Ocular: Sclerae unicteric, pupils equal, round and reactive to light Ear-nose-throat: Oropharynx clear, dentition fair Lymphatic: No cervical, supraclavicular or axillary adenopathy Lungs no rales or rhonchi, good excursion bilaterally Heart regular rate and rhythm, no murmur appreciated Abd soft, nontender, positive bowel sounds, no liver or spleen tip palpated on exam, no fluid wave  MSK no focal spinal tenderness, no joint edema Neuro: non-focal, well-oriented, appropriate affect Breasts: Deferred   Lab Results  Component Value Date   WBC 7.4 02/05/2018   HGB 13.7 02/05/2018   HCT 42.8 02/05/2018   MCV 82.0 02/05/2018   PLT 235 02/05/2018   Lab Results  Component Value Date   FERRITIN 12 05/26/2009   IRON 19 (L) 05/26/2009   TIBC 354 05/26/2009   UIBC 335 05/26/2009   IRONPCTSAT 5 (L) 05/26/2009   Lab Results  Component Value Date   RETICCTPCT 1.3 05/26/2009   RBC 5.22 02/05/2018   No results found for: KPAFRELGTCHN, LAMBDASER, KAPLAMBRATIO No results found for: IGGSERUM, IGA, IGMSERUM No results   found for: Odetta Pink, SPEI   Chemistry      Component Value Date/Time   NA 143 11/06/2017 1333   K 3.8 11/06/2017 1333   CL 108 11/06/2017 1333   CO2 23 11/06/2017 1333   BUN 24 11/06/2017 1333   CREATININE 0.91 11/06/2017 1333      Component Value Date/Time   CALCIUM 10.4 11/06/2017 1333   ALKPHOS 94 11/06/2017 1333   AST 21 11/06/2017 1333   ALT 20 11/06/2017 1333   BILITOT 0.3 11/06/2017 1333      Impression and Plan: Laura Campbell is a very pleasant African American female with history of DVT and recurrent PE. She has both  protein C and Protein S deficiency. She continues to do well and is asymptomatic at this time.  She will continue her same regimen with Eliquis.  We will see her back in another 4 months for follow-up.  She will contact our office with any questions or concerns. We can certainly see her sooner if need be.   Laura Peace, NP 5/3/20191:27 PM

## 2018-02-22 DIAGNOSIS — M1711 Unilateral primary osteoarthritis, right knee: Secondary | ICD-10-CM | POA: Diagnosis not present

## 2018-03-05 DIAGNOSIS — E118 Type 2 diabetes mellitus with unspecified complications: Secondary | ICD-10-CM | POA: Diagnosis not present

## 2018-03-05 DIAGNOSIS — E7800 Pure hypercholesterolemia, unspecified: Secondary | ICD-10-CM | POA: Diagnosis not present

## 2018-03-05 DIAGNOSIS — Z86718 Personal history of other venous thrombosis and embolism: Secondary | ICD-10-CM | POA: Diagnosis not present

## 2018-03-05 DIAGNOSIS — E785 Hyperlipidemia, unspecified: Secondary | ICD-10-CM | POA: Diagnosis not present

## 2018-03-05 DIAGNOSIS — R635 Abnormal weight gain: Secondary | ICD-10-CM | POA: Diagnosis not present

## 2018-03-09 ENCOUNTER — Other Ambulatory Visit: Payer: Self-pay | Admitting: Sports Medicine

## 2018-03-09 DIAGNOSIS — M25561 Pain in right knee: Secondary | ICD-10-CM

## 2018-03-09 DIAGNOSIS — M1990 Unspecified osteoarthritis, unspecified site: Secondary | ICD-10-CM

## 2018-03-09 DIAGNOSIS — M1711 Unilateral primary osteoarthritis, right knee: Secondary | ICD-10-CM | POA: Diagnosis not present

## 2018-03-19 ENCOUNTER — Ambulatory Visit
Admission: RE | Admit: 2018-03-19 | Discharge: 2018-03-19 | Disposition: A | Discharge status: Home / Self Care | Payer: Medicare HMO | Source: Ambulatory Visit | Attending: Sports Medicine | Admitting: Sports Medicine

## 2018-03-19 DIAGNOSIS — M23221 Derangement of posterior horn of medial meniscus due to old tear or injury, right knee: Secondary | ICD-10-CM | POA: Diagnosis not present

## 2018-03-19 DIAGNOSIS — M25561 Pain in right knee: Secondary | ICD-10-CM

## 2018-03-19 DIAGNOSIS — M1990 Unspecified osteoarthritis, unspecified site: Secondary | ICD-10-CM

## 2018-06-11 ENCOUNTER — Other Ambulatory Visit: Payer: Self-pay

## 2018-06-11 ENCOUNTER — Other Ambulatory Visit: Payer: Self-pay | Admitting: Oncology

## 2018-06-11 ENCOUNTER — Other Ambulatory Visit: Payer: Self-pay | Admitting: Family

## 2018-06-11 ENCOUNTER — Inpatient Hospital Stay: Payer: Medicare HMO

## 2018-06-11 ENCOUNTER — Telehealth: Payer: Self-pay | Admitting: Oncology

## 2018-06-11 ENCOUNTER — Inpatient Hospital Stay: Payer: Medicare HMO | Attending: Family | Admitting: Family

## 2018-06-11 ENCOUNTER — Encounter: Payer: Self-pay | Admitting: Family

## 2018-06-11 VITALS — BP 142/78 | HR 67 | Temp 97.7°F | Resp 16 | Wt 252.0 lb

## 2018-06-11 DIAGNOSIS — Z86711 Personal history of pulmonary embolism: Secondary | ICD-10-CM

## 2018-06-11 DIAGNOSIS — D6859 Other primary thrombophilia: Secondary | ICD-10-CM

## 2018-06-11 DIAGNOSIS — Z86718 Personal history of other venous thrombosis and embolism: Secondary | ICD-10-CM

## 2018-06-11 DIAGNOSIS — Z7901 Long term (current) use of anticoagulants: Secondary | ICD-10-CM | POA: Insufficient documentation

## 2018-06-11 LAB — CBC WITH DIFFERENTIAL (CANCER CENTER ONLY)
Basophils Absolute: 0 10*3/uL (ref 0.0–0.1)
Basophils Relative: 1 %
EOS PCT: 8 %
Eosinophils Absolute: 0.6 10*3/uL — ABNORMAL HIGH (ref 0.0–0.5)
HCT: 40.2 % (ref 34.8–46.6)
Hemoglobin: 12.6 g/dL (ref 11.6–15.9)
LYMPHS ABS: 1.7 10*3/uL (ref 0.9–3.3)
LYMPHS PCT: 25 %
MCH: 25.8 pg — AB (ref 26.0–34.0)
MCHC: 31.3 g/dL — AB (ref 32.0–36.0)
MCV: 82.2 fL (ref 81.0–101.0)
MONO ABS: 0.3 10*3/uL (ref 0.1–0.9)
Monocytes Relative: 5 %
Neutro Abs: 4.2 10*3/uL (ref 1.5–6.5)
Neutrophils Relative %: 61 %
PLATELETS: 234 10*3/uL (ref 145–400)
RBC: 4.89 MIL/uL (ref 3.70–5.32)
RDW: 14.5 % (ref 11.1–15.7)
WBC Count: 6.8 10*3/uL (ref 3.9–10.0)

## 2018-06-11 LAB — CMP (CANCER CENTER ONLY)
ALT: 13 U/L (ref 0–44)
ANION GAP: 10 (ref 5–15)
AST: 17 U/L (ref 15–41)
Albumin: 3.6 g/dL (ref 3.5–5.0)
Alkaline Phosphatase: 85 U/L (ref 38–126)
BUN: 20 mg/dL (ref 6–20)
CALCIUM: 10.6 mg/dL — AB (ref 8.9–10.3)
CHLORIDE: 106 mmol/L (ref 98–111)
CO2: 25 mmol/L (ref 22–32)
CREATININE: 0.89 mg/dL (ref 0.44–1.00)
Glucose, Bld: 166 mg/dL — ABNORMAL HIGH (ref 70–99)
Potassium: 3.8 mmol/L (ref 3.5–5.1)
Sodium: 141 mmol/L (ref 135–145)
Total Bilirubin: 0.3 mg/dL (ref 0.3–1.2)
Total Protein: 8.1 g/dL (ref 6.5–8.1)

## 2018-06-11 MED ORDER — APIXABAN 2.5 MG PO TABS: 2.5000 mg | ORAL_TABLET | Freq: Two times a day (BID) | ORAL | 6 refills | Status: DC

## 2018-06-11 NOTE — Telephone Encounter (Signed)
No answer. Called patient regarding the prescription for her compression hose.

## 2018-06-11 NOTE — Progress Notes (Signed)
Hematology and Oncology Follow Up Visit  Laura Campbell [id number] 02/13/1961 57 y.o. 06/11/2018   Principle Diagnosis:  History of recurrent PE/DVT Protein C and S deficiency  Current Therapy:   Eliquis 5 mg PO BID- started June 2018   Interim History: Laura Campbell is here today with her grandson for follow-up. She is doing well and has no complaints at this time.  She is doing well on Eliquis 5 mg PO BID and has had no episodes of bleeding, no bruising or petechiae.  She will having some tingling in the left lower extremity when she is on her feet for an extended period of time. This resolves if she takes a break and elevates her leg. She needs a new compression stocking.  No fever, chills, n/v, cough, rash, dizziness, SOB, chest pain, palpitations, abdominal pain or changes in bowel or bladder habits.  No swelling, tenderness, numbness or tingling in her extremities at this time.  No lymphadenopathy noted on exam.  She has maintained a good appetite and is staying well hydrated. Her weight is stable.   ECOG Performance Status: 1 - Symptomatic but completely ambulatory  Medications:  Allergies as of 06/11/2018   No Known Allergies     Medication List        Accurate as of 06/11/18 11:50 AM. Always use your most recent med list.          ELIQUIS 5 MG Tabs tablet Generic drug:  apixaban Take 5 mg by mouth 2 (two) times daily.   gabapentin 300 MG capsule Commonly known as:  NEURONTIN Take 300 mg by mouth 3 (three) times daily.   hydrochlorothiazide 25 MG tablet Commonly known as:  HYDRODIURIL Take 25 mg by mouth daily.   metFORMIN 500 MG tablet Commonly known as:  GLUCOPHAGE Take 500 mg by mouth daily.   ONE TOUCH ULTRA MINI w/Device Kit   ONE TOUCH ULTRA TEST test strip Generic drug:  glucose blood   potassium chloride 10 MEQ CR capsule Commonly known as:  MICRO-K Take 10 mEq by mouth daily.   rosuvastatin 5 MG tablet Commonly known as:  CRESTOR Take 5 mg  by mouth daily.   telmisartan 80 MG tablet Commonly known as:  MICARDIS Take 80 mg by mouth daily.       Allergies: No Known Allergies  Past Medical History, Surgical history, Social history, and Family History were reviewed and updated.  Review of Systems: All other 10 point review of systems is negative.   Physical Exam:  vitals were not taken for this visit.   Wt Readings from Last 3 Encounters:  02/05/18 244 lb (110.7 kg)  11/06/17 247 lb 12.8 oz (112.4 kg)  10/07/17 246 lb 1.9 oz (111.6 kg)    Ocular: Sclerae unicteric, pupils equal, round and reactive to light Ear-nose-throat: Oropharynx clear, dentition fair Lymphatic: No cervical, supraclavicular or axillary adenopathy Lungs no rales or rhonchi, good excursion bilaterally Heart regular rate and rhythm, no murmur appreciated Abd soft, nontender, positive bowel sounds, no liver or spleen tip palpated on exam, no fluid wave  MSK no focal spinal tenderness, no joint edema Neuro: non-focal, well-oriented, appropriate affect Breasts: Deferred   Lab Results  Component Value Date   WBC 6.8 06/11/2018   HGB 12.6 06/11/2018   HCT 40.2 06/11/2018   MCV 82.2 06/11/2018   PLT 234 06/11/2018   Lab Results  Component Value Date   FERRITIN 12 05/26/2009   IRON 19 (L) 05/26/2009   TIBC 354  05/26/2009   UIBC 335 05/26/2009   IRONPCTSAT 5 (L) 05/26/2009   Lab Results  Component Value Date   RETICCTPCT 1.3 05/26/2009   RBC 4.89 06/11/2018   No results found for: KPAFRELGTCHN, LAMBDASER, KAPLAMBRATIO No results found for: IGGSERUM, IGA, IGMSERUM No results found for: Odetta Pink, SPEI   Chemistry      Component Value Date/Time   NA 141 02/05/2018 1314   K 4.1 02/05/2018 1314   CL 107 02/05/2018 1314   CO2 26 02/05/2018 1314   BUN 25 02/05/2018 1314   CREATININE 0.94 02/05/2018 1314      Component Value Date/Time   CALCIUM 10.5 (H) 02/05/2018 1314    ALKPHOS 81 02/05/2018 1314   AST 20 02/05/2018 1314   ALT 23 02/05/2018 1314   BILITOT 0.3 02/05/2018 1314      Impression and Plan: Laura Campbell is a very pleasant 57 yo African American female with history of recurrent DVT/PE as well as Protein C and S deficiencies.  She has done well on Eliquis for over a year now. She has no complaints at this time.  I spoke with Dr. Marin Olp and we will now decrease her to a maintenance dose of Eliquis 2.5 mg PO BID.  She is in agreement with the plan.  Order placed for 2 compression stockings.  We will plan to see her back in another 6 months for follow-up.  She will contact our office with any questions or concerns. We can certainly see her sooner if need be.   Laura Peace, NP 9/6/201911:50 AM

## 2018-07-12 DIAGNOSIS — I10 Essential (primary) hypertension: Secondary | ICD-10-CM | POA: Diagnosis not present

## 2018-07-12 DIAGNOSIS — Z6841 Body Mass Index (BMI) 40.0 and over, adult: Secondary | ICD-10-CM | POA: Diagnosis not present

## 2018-07-12 DIAGNOSIS — E118 Type 2 diabetes mellitus with unspecified complications: Secondary | ICD-10-CM | POA: Diagnosis not present

## 2018-07-12 DIAGNOSIS — E785 Hyperlipidemia, unspecified: Secondary | ICD-10-CM | POA: Diagnosis not present

## 2018-07-12 DIAGNOSIS — D6859 Other primary thrombophilia: Secondary | ICD-10-CM | POA: Diagnosis not present

## 2018-07-20 ENCOUNTER — Other Ambulatory Visit: Payer: Self-pay | Admitting: Family

## 2018-07-20 DIAGNOSIS — M545 Low back pain: Secondary | ICD-10-CM | POA: Diagnosis not present

## 2018-07-27 DIAGNOSIS — E1100 Type 2 diabetes mellitus with hyperosmolarity without nonketotic hyperglycemic-hyperosmolar coma (NKHHC): Secondary | ICD-10-CM | POA: Diagnosis not present

## 2018-07-27 DIAGNOSIS — Z6841 Body Mass Index (BMI) 40.0 and over, adult: Secondary | ICD-10-CM | POA: Diagnosis not present

## 2018-07-30 DIAGNOSIS — M5416 Radiculopathy, lumbar region: Secondary | ICD-10-CM | POA: Diagnosis not present

## 2018-07-30 DIAGNOSIS — M545 Low back pain: Secondary | ICD-10-CM | POA: Diagnosis not present

## 2018-08-17 DIAGNOSIS — M7061 Trochanteric bursitis, right hip: Secondary | ICD-10-CM | POA: Diagnosis not present

## 2018-08-17 DIAGNOSIS — M545 Low back pain: Secondary | ICD-10-CM | POA: Diagnosis not present

## 2018-08-17 DIAGNOSIS — M7062 Trochanteric bursitis, left hip: Secondary | ICD-10-CM | POA: Diagnosis not present

## 2018-08-19 DIAGNOSIS — M7062 Trochanteric bursitis, left hip: Secondary | ICD-10-CM | POA: Diagnosis not present

## 2018-08-30 DIAGNOSIS — M7062 Trochanteric bursitis, left hip: Secondary | ICD-10-CM | POA: Diagnosis not present

## 2018-10-02 DIAGNOSIS — J069 Acute upper respiratory infection, unspecified: Secondary | ICD-10-CM | POA: Diagnosis not present

## 2018-10-25 DIAGNOSIS — E785 Hyperlipidemia, unspecified: Secondary | ICD-10-CM | POA: Diagnosis not present

## 2018-10-25 DIAGNOSIS — E1100 Type 2 diabetes mellitus with hyperosmolarity without nonketotic hyperglycemic-hyperosmolar coma (NKHHC): Secondary | ICD-10-CM | POA: Diagnosis not present

## 2018-10-25 DIAGNOSIS — Z86711 Personal history of pulmonary embolism: Secondary | ICD-10-CM | POA: Diagnosis not present

## 2018-10-25 DIAGNOSIS — Z6841 Body Mass Index (BMI) 40.0 and over, adult: Secondary | ICD-10-CM | POA: Diagnosis not present

## 2018-10-25 DIAGNOSIS — E7800 Pure hypercholesterolemia, unspecified: Secondary | ICD-10-CM | POA: Diagnosis not present

## 2018-10-25 DIAGNOSIS — I10 Essential (primary) hypertension: Secondary | ICD-10-CM | POA: Diagnosis not present

## 2018-10-25 DIAGNOSIS — D6859 Other primary thrombophilia: Secondary | ICD-10-CM | POA: Diagnosis not present

## 2018-10-26 ENCOUNTER — Other Ambulatory Visit: Payer: Self-pay | Admitting: Family Medicine

## 2018-10-26 DIAGNOSIS — Z1231 Encounter for screening mammogram for malignant neoplasm of breast: Secondary | ICD-10-CM

## 2018-11-26 ENCOUNTER — Ambulatory Visit: Payer: Medicare HMO

## 2018-12-10 ENCOUNTER — Inpatient Hospital Stay: Payer: Medicare HMO

## 2018-12-10 ENCOUNTER — Encounter: Payer: Self-pay | Admitting: Hematology & Oncology

## 2018-12-10 ENCOUNTER — Inpatient Hospital Stay: Payer: Medicare HMO | Attending: Family | Admitting: Hematology & Oncology

## 2018-12-10 ENCOUNTER — Other Ambulatory Visit: Payer: Self-pay

## 2018-12-10 VITALS — BP 142/76 | HR 71 | Temp 97.7°F | Resp 19 | Wt 244.0 lb

## 2018-12-10 DIAGNOSIS — D6859 Other primary thrombophilia: Secondary | ICD-10-CM

## 2018-12-10 DIAGNOSIS — G609 Hereditary and idiopathic neuropathy, unspecified: Secondary | ICD-10-CM

## 2018-12-10 DIAGNOSIS — Z7901 Long term (current) use of anticoagulants: Secondary | ICD-10-CM

## 2018-12-10 DIAGNOSIS — Z86718 Personal history of other venous thrombosis and embolism: Secondary | ICD-10-CM

## 2018-12-10 DIAGNOSIS — E119 Type 2 diabetes mellitus without complications: Secondary | ICD-10-CM | POA: Diagnosis not present

## 2018-12-10 DIAGNOSIS — Z86711 Personal history of pulmonary embolism: Secondary | ICD-10-CM

## 2018-12-10 DIAGNOSIS — I82503 Chronic embolism and thrombosis of unspecified deep veins of lower extremity, bilateral: Secondary | ICD-10-CM

## 2018-12-10 LAB — CMP (CANCER CENTER ONLY)
ALBUMIN: 4.2 g/dL (ref 3.5–5.0)
ALK PHOS: 79 U/L (ref 38–126)
ALT: 13 U/L (ref 0–44)
ANION GAP: 7 (ref 5–15)
AST: 13 U/L — ABNORMAL LOW (ref 15–41)
BUN: 17 mg/dL (ref 6–20)
CALCIUM: 10.3 mg/dL (ref 8.9–10.3)
CHLORIDE: 104 mmol/L (ref 98–111)
CO2: 30 mmol/L (ref 22–32)
Creatinine: 0.9 mg/dL (ref 0.44–1.00)
GFR, Estimated: >60 mL/min (ref >60)
GLUCOSE: 199 mg/dL — AB (ref 70–99)
POTASSIUM: 3.6 mmol/L (ref 3.5–5.1)
SODIUM: 141 mmol/L (ref 135–145)
Total Bilirubin: 0.4 mg/dL (ref 0.3–1.2)
Total Protein: 7.8 g/dL (ref 6.5–8.1)

## 2018-12-10 LAB — CBC WITH DIFFERENTIAL (CANCER CENTER ONLY)
ABS IMMATURE GRANULOCYTES: 0.01 10*3/uL (ref 0.00–0.07)
Basophils Absolute: 0 10*3/uL (ref 0.0–0.1)
Basophils Relative: 1 %
EOS ABS: 0.3 10*3/uL (ref 0.0–0.5)
Eosinophils Relative: 6 %
HEMATOCRIT: 42.2 % (ref 36.0–46.0)
Hemoglobin: 12.9 g/dL (ref 12.0–15.0)
IMMATURE GRANULOCYTES: 0 %
LYMPHS ABS: 1.7 10*3/uL (ref 0.7–4.0)
LYMPHS PCT: 28 %
MCH: 25.4 pg — ABNORMAL LOW (ref 26.0–34.0)
MCHC: 30.6 g/dL (ref 30.0–36.0)
MCV: 83.1 fL (ref 80.0–100.0)
MONOS PCT: 6 %
Monocytes Absolute: 0.4 10*3/uL (ref 0.1–1.0)
NEUTROS ABS: 3.6 10*3/uL (ref 1.7–7.7)
NEUTROS PCT: 59 %
NRBC: 0 % (ref 0.0–0.2)
Platelet Count: 268 10*3/uL (ref 150–400)
RBC: 5.08 MIL/uL (ref 3.87–5.11)
RDW: 14.8 % (ref 11.5–15.5)
WBC Count: 6.1 10*3/uL (ref 4.0–10.5)

## 2018-12-10 NOTE — Progress Notes (Signed)
Hematology and Oncology Follow Up Visit  Laura Campbell [id number] 05-Sep-1961 58 y.o. 12/10/2018   Principle Diagnosis:  History of recurrent PE/DVT Protein C and S deficiency  Current Therapy:   Eliquis 2.5 mg PO BID- started June 2018   Interim History: Laura Campbell is here today with her grandson for follow-up.  She is doing pretty well.  She really has had no complaints since we saw her back in September.  She is on maintenance Eliquis right now.  She is having no issues with this.  There is no bleeding.  She is having no problems with cough or shortness of breath.  There is no chest wall pain.  She has had no issues with leg swelling or pain.  She does have diabetes.  This might be her biggest clinical problem.  She has had no rashes.  She is very busy this summer.  She has family trips planned with her grandchildren.  Overall, her performance status is ECOG 0.    Medications:  Allergies as of 12/10/2018   No Known Allergies     Medication List       Accurate as of December 10, 2018 12:20 PM. Always use your most recent med list.        apixaban 2.5 MG Tabs tablet Commonly known as:  Eliquis Take 1 tablet (2.5 mg total) by mouth 2 (two) times daily.   cyclobenzaprine 10 MG tablet Commonly known as:  FLEXERIL Take 10 mg by mouth.   gabapentin 300 MG capsule Commonly known as:  NEURONTIN Take 300 mg by mouth 3 (three) times daily.   hydrochlorothiazide 25 MG tablet Commonly known as:  HYDRODIURIL Take 25 mg by mouth daily.   metFORMIN 500 MG tablet Commonly known as:  GLUCOPHAGE Take 500 mg by mouth daily.   ONE TOUCH ULTRA MINI w/Device Kit   ONE TOUCH ULTRA TEST test strip Generic drug:  glucose blood   potassium chloride 10 MEQ CR capsule Commonly known as:  MICRO-K Take 10 mEq by mouth daily.   rosuvastatin 5 MG tablet Commonly known as:  CRESTOR Take 5 mg by mouth daily.   telmisartan 80 MG tablet Commonly known as:  MICARDIS Take 80 mg by  mouth daily.       Allergies: No Known Allergies  Past Medical History, Surgical history, Social history, and Family History were reviewed and updated.  Review of Systems: Review of Systems  Constitutional: Negative.   HENT: Negative.   Eyes: Negative.   Respiratory: Negative.   Cardiovascular: Negative.   Gastrointestinal: Negative.   Genitourinary: Negative.   Musculoskeletal: Negative.   Skin: Negative.   Neurological: Negative.   Endo/Heme/Allergies: Negative.   Psychiatric/Behavioral: Negative.       Physical Exam:  weight is 244 lb (110.7 kg). Her oral temperature is 97.7 F (36.5 C). Her blood pressure is 142/76 (abnormal) and her pulse is 71. Her respiration is 19 and oxygen saturation is 99%.   Wt Readings from Last 3 Encounters:  12/10/18 244 lb (110.7 kg)  06/11/18 252 lb (114.3 kg)  02/05/18 244 lb (110.7 kg)    Physical Exam Vitals signs reviewed.  HENT:     Head: Normocephalic and atraumatic.  Eyes:     Pupils: Pupils are equal, round, and reactive to light.  Neck:     Musculoskeletal: Normal range of motion.  Cardiovascular:     Rate and Rhythm: Normal rate and regular rhythm.     Heart sounds: Normal heart sounds.  Pulmonary:     Effort: Pulmonary effort is normal.     Breath sounds: Normal breath sounds.  Abdominal:     General: Bowel sounds are normal.     Palpations: Abdomen is soft.  Musculoskeletal: Normal range of motion.        General: No tenderness or deformity.  Lymphadenopathy:     Cervical: No cervical adenopathy.  Skin:    General: Skin is warm and dry.     Findings: No erythema or rash.  Neurological:     Mental Status: She is alert and oriented to person, place, and time.  Psychiatric:        Behavior: Behavior normal.        Thought Content: Thought content normal.        Judgment: Judgment normal.      Lab Results  Component Value Date   WBC 6.1 12/10/2018   HGB 12.9 12/10/2018   HCT 42.2 12/10/2018   MCV 83.1  12/10/2018   PLT 268 12/10/2018   Lab Results  Component Value Date   FERRITIN 12 05/26/2009   IRON 19 (L) 05/26/2009   TIBC 354 05/26/2009   UIBC 335 05/26/2009   IRONPCTSAT 5 (L) 05/26/2009   Lab Results  Component Value Date   RETICCTPCT 1.3 05/26/2009   RBC 5.08 12/10/2018   No results found for: KPAFRELGTCHN, LAMBDASER, KAPLAMBRATIO No results found for: IGGSERUM, IGA, IGMSERUM No results found for: Odetta Pink, SPEI   Chemistry      Component Value Date/Time   NA 141 12/10/2018 1119   K 3.6 12/10/2018 1119   CL 104 12/10/2018 1119   CO2 30 12/10/2018 1119   BUN 17 12/10/2018 1119   CREATININE 0.90 12/10/2018 1119      Component Value Date/Time   CALCIUM 10.3 12/10/2018 1119   ALKPHOS 79 12/10/2018 1119   AST 13 (L) 12/10/2018 1119   ALT 13 12/10/2018 1119   BILITOT 0.4 12/10/2018 1119      Impression and Plan: Laura Campbell is a very pleasant 58 yo African American female with history of recurrent DVT/PE as well as Protein C and S deficiencies.   Her first blood clot was about 10 years ago.  She had a second one back in 2018.  I would hate to commit her to lifelong anticoagulation.  This was quite a long interval between having blood clots.  I am going to recheck her Protein C and Protein S levels.  I want to see her back in 6 months.   Volanda Napoleon, MD 3/6/202012:20 PM

## 2018-12-11 LAB — PROTEIN S, TOTAL: Protein S Ag, Total: 58 % — ABNORMAL LOW (ref 60–150)

## 2018-12-11 LAB — PROTEIN C ACTIVITY: PROTEIN C ACTIVITY: 65 % — AB (ref 73–180)

## 2018-12-11 LAB — PROTEIN C, TOTAL: PROTEIN C, TOTAL: 57 % — AB (ref 60–150)

## 2018-12-11 LAB — PROTEIN S ACTIVITY: Protein S Activity: 42 % — ABNORMAL LOW (ref 63–140)

## 2018-12-21 ENCOUNTER — Ambulatory Visit
Admission: RE | Admit: 2018-12-21 | Discharge: 2018-12-21 | Disposition: A | Discharge status: Home / Self Care | Payer: Medicare HMO | Source: Ambulatory Visit | Attending: Family Medicine | Admitting: Family Medicine

## 2018-12-21 ENCOUNTER — Other Ambulatory Visit: Payer: Self-pay

## 2018-12-21 DIAGNOSIS — H52223 Regular astigmatism, bilateral: Secondary | ICD-10-CM | POA: Diagnosis not present

## 2018-12-21 DIAGNOSIS — Z1231 Encounter for screening mammogram for malignant neoplasm of breast: Secondary | ICD-10-CM | POA: Diagnosis not present

## 2019-03-16 DIAGNOSIS — I2692 Saddle embolus of pulmonary artery without acute cor pulmonale: Secondary | ICD-10-CM | POA: Diagnosis not present

## 2019-03-16 DIAGNOSIS — Z86718 Personal history of other venous thrombosis and embolism: Secondary | ICD-10-CM | POA: Diagnosis not present

## 2019-03-16 DIAGNOSIS — E1169 Type 2 diabetes mellitus with other specified complication: Secondary | ICD-10-CM | POA: Diagnosis not present

## 2019-03-16 DIAGNOSIS — D6859 Other primary thrombophilia: Secondary | ICD-10-CM | POA: Diagnosis not present

## 2019-03-16 DIAGNOSIS — R635 Abnormal weight gain: Secondary | ICD-10-CM | POA: Diagnosis not present

## 2019-03-16 DIAGNOSIS — D6851 Activated protein C resistance: Secondary | ICD-10-CM | POA: Diagnosis not present

## 2019-03-16 DIAGNOSIS — I10 Essential (primary) hypertension: Secondary | ICD-10-CM | POA: Diagnosis not present

## 2019-03-16 DIAGNOSIS — E118 Type 2 diabetes mellitus with unspecified complications: Secondary | ICD-10-CM | POA: Diagnosis not present

## 2019-03-16 DIAGNOSIS — E785 Hyperlipidemia, unspecified: Secondary | ICD-10-CM | POA: Diagnosis not present

## 2019-03-16 DIAGNOSIS — E1100 Type 2 diabetes mellitus with hyperosmolarity without nonketotic hyperglycemic-hyperosmolar coma (NKHHC): Secondary | ICD-10-CM | POA: Diagnosis not present

## 2019-03-30 DIAGNOSIS — E1169 Type 2 diabetes mellitus with other specified complication: Secondary | ICD-10-CM | POA: Diagnosis not present

## 2019-03-30 DIAGNOSIS — I10 Essential (primary) hypertension: Secondary | ICD-10-CM | POA: Diagnosis not present

## 2019-03-30 DIAGNOSIS — E785 Hyperlipidemia, unspecified: Secondary | ICD-10-CM | POA: Diagnosis not present

## 2019-04-12 ENCOUNTER — Other Ambulatory Visit: Payer: Self-pay | Admitting: Family

## 2019-04-12 DIAGNOSIS — Z86718 Personal history of other venous thrombosis and embolism: Secondary | ICD-10-CM

## 2019-04-12 DIAGNOSIS — D6859 Other primary thrombophilia: Secondary | ICD-10-CM

## 2019-04-12 DIAGNOSIS — Z86711 Personal history of pulmonary embolism: Secondary | ICD-10-CM

## 2019-04-12 MED ORDER — APIXABAN 2.5 MG PO TABS: 2.5000 mg | ORAL_TABLET | Freq: Two times a day (BID) | ORAL | 6 refills | Status: DC

## 2019-06-16 ENCOUNTER — Ambulatory Visit: Payer: Medicare HMO | Admitting: Hematology & Oncology

## 2019-06-16 ENCOUNTER — Other Ambulatory Visit: Payer: Medicare HMO

## 2019-06-22 ENCOUNTER — Encounter: Payer: Self-pay | Admitting: Family

## 2019-06-22 ENCOUNTER — Inpatient Hospital Stay (HOSPITAL_BASED_OUTPATIENT_CLINIC_OR_DEPARTMENT_OTHER): Payer: Medicare HMO | Admitting: Family

## 2019-06-22 ENCOUNTER — Inpatient Hospital Stay: Payer: Medicare HMO | Attending: Family

## 2019-06-22 ENCOUNTER — Telehealth: Payer: Self-pay | Admitting: Family

## 2019-06-22 ENCOUNTER — Other Ambulatory Visit: Payer: Self-pay

## 2019-06-22 VITALS — BP 151/86 | HR 61 | Temp 97.3°F | Resp 18 | Ht 61.0 in | Wt 240.0 lb

## 2019-06-22 DIAGNOSIS — I82503 Chronic embolism and thrombosis of unspecified deep veins of lower extremity, bilateral: Secondary | ICD-10-CM

## 2019-06-22 DIAGNOSIS — D6859 Other primary thrombophilia: Secondary | ICD-10-CM | POA: Diagnosis not present

## 2019-06-22 DIAGNOSIS — Z86711 Personal history of pulmonary embolism: Secondary | ICD-10-CM | POA: Diagnosis not present

## 2019-06-22 DIAGNOSIS — Z7901 Long term (current) use of anticoagulants: Secondary | ICD-10-CM | POA: Insufficient documentation

## 2019-06-22 DIAGNOSIS — Z86718 Personal history of other venous thrombosis and embolism: Secondary | ICD-10-CM | POA: Diagnosis not present

## 2019-06-22 LAB — CBC WITH DIFFERENTIAL (CANCER CENTER ONLY)
Abs Immature Granulocytes: 0.02 10*3/uL (ref 0.00–0.07)
Basophils Absolute: 0.1 10*3/uL (ref 0.0–0.1)
Basophils Relative: 1 %
Eosinophils Absolute: 0.4 10*3/uL (ref 0.0–0.5)
Eosinophils Relative: 5 %
HCT: 45 % (ref 36.0–46.0)
Hemoglobin: 13.7 g/dL (ref 12.0–15.0)
Immature Granulocytes: 0 %
Lymphocytes Relative: 36 %
Lymphs Abs: 2.7 10*3/uL (ref 0.7–4.0)
MCH: 24.8 pg — ABNORMAL LOW (ref 26.0–34.0)
MCHC: 30.4 g/dL (ref 30.0–36.0)
MCV: 81.4 fL (ref 80.0–100.0)
Monocytes Absolute: 0.5 10*3/uL (ref 0.1–1.0)
Monocytes Relative: 7 %
Neutro Abs: 3.8 10*3/uL (ref 1.7–7.7)
Neutrophils Relative %: 51 %
Platelet Count: 258 10*3/uL (ref 150–400)
RBC: 5.53 MIL/uL — ABNORMAL HIGH (ref 3.87–5.11)
RDW: 15.5 % (ref 11.5–15.5)
WBC Count: 7.4 10*3/uL (ref 4.0–10.5)
nRBC: 0 % (ref 0.0–0.2)

## 2019-06-22 LAB — CMP (CANCER CENTER ONLY)
ALT: 11 U/L (ref 0–44)
AST: 15 U/L (ref 15–41)
Albumin: 4.2 g/dL (ref 3.5–5.0)
Alkaline Phosphatase: 81 U/L (ref 38–126)
Anion gap: 9 (ref 5–15)
BUN: 20 mg/dL (ref 6–20)
CO2: 27 mmol/L (ref 22–32)
Calcium: 10.5 mg/dL — ABNORMAL HIGH (ref 8.9–10.3)
Chloride: 104 mmol/L (ref 98–111)
Creatinine: 1.05 mg/dL — ABNORMAL HIGH (ref 0.44–1.00)
GFR, Est AFR Am: >60 mL/min (ref >60)
GFR, Estimated: 58 mL/min — ABNORMAL LOW (ref >60)
Glucose, Bld: 86 mg/dL (ref 70–99)
Potassium: 3.9 mmol/L (ref 3.5–5.1)
Sodium: 140 mmol/L (ref 135–145)
Total Bilirubin: 0.4 mg/dL (ref 0.3–1.2)
Total Protein: 7.8 g/dL (ref 6.5–8.1)

## 2019-06-22 NOTE — Telephone Encounter (Signed)
Called and advised patient of appointments added per 9/16 los

## 2019-06-22 NOTE — Progress Notes (Signed)
Hematology and Oncology Follow Up Visit  Laura Campbell [id number] 1960-11-12 58 y.o. 06/22/2019   Principle Diagnosis:  History of recurrent PE/DVT Protein C and S deficiency  Current Therapy:   Eliquis 2.5 mg PO BID- started June 2018   Interim History:  Laura Campbell is here today for follow-up. She is doing well and has no complaints at this time.  She continues to tolerate her Eliquis nicely.  She has had no issues with bleeding. No bruising or petechiae.  No fever, chills, n/v, cough, rash, dizziness, SOB, chest pain, palpitations, abdominal pain or changes in bowel or bladder habits.  No swelling, tenderness, numbness or tingling in her extremities.  She is eating well and staying hydrated. Her weight is stable.   ECOG Performance Status: 0 - Asymptomatic  Medications:  Allergies as of 06/22/2019   No Known Allergies     Medication List       Accurate as of June 22, 2019  1:27 PM. If you have any questions, ask your nurse or doctor.        apixaban 2.5 MG Tabs tablet Commonly known as: Eliquis Take 1 tablet (2.5 mg total) by mouth 2 (two) times daily.   cyclobenzaprine 10 MG tablet Commonly known as: FLEXERIL Take 10 mg by mouth.   gabapentin 300 MG capsule Commonly known as: NEURONTIN Take 300 mg by mouth 3 (three) times daily.   hydrochlorothiazide 25 MG tablet Commonly known as: HYDRODIURIL Take 25 mg by mouth daily.   metFORMIN 500 MG tablet Commonly known as: GLUCOPHAGE Take 500 mg by mouth daily.   ONE TOUCH ULTRA MINI w/Device Kit   ONE TOUCH ULTRA TEST test strip Generic drug: glucose blood   potassium chloride 10 MEQ CR capsule Commonly known as: MICRO-K Take 10 mEq by mouth daily.   rosuvastatin 5 MG tablet Commonly known as: CRESTOR Take 5 mg by mouth daily.   telmisartan 80 MG tablet Commonly known as: MICARDIS Take 80 mg by mouth daily.       Allergies: No Known Allergies  Past Medical History, Surgical history,  Social history, and Family History were reviewed and updated.  Review of Systems: All other 10 point review of systems is negative.   Physical Exam:  vitals were not taken for this visit.   Wt Readings from Last 3 Encounters:  12/10/18 244 lb (110.7 kg)  06/11/18 252 lb (114.3 kg)  02/05/18 244 lb (110.7 kg)    Ocular: Sclerae unicteric, pupils equal, round and reactive to light Ear-nose-throat: Oropharynx clear, dentition fair Lymphatic: No cervical or supraclavicular adenopathy Lungs no rales or rhonchi, good excursion bilaterally Heart regular rate and rhythm, no murmur appreciated Abd soft, nontender, positive bowel sounds, no liver or spleen tip palpated on exam, no fluid wave  MSK no focal spinal tenderness, no joint edema Neuro: non-focal, well-oriented, appropriate affect Breasts: Deferred   Lab Results  Component Value Date   WBC 6.1 12/10/2018   HGB 12.9 12/10/2018   HCT 42.2 12/10/2018   MCV 83.1 12/10/2018   PLT 268 12/10/2018   Lab Results  Component Value Date   FERRITIN 12 05/26/2009   IRON 19 (L) 05/26/2009   TIBC 354 05/26/2009   UIBC 335 05/26/2009   IRONPCTSAT 5 (L) 05/26/2009   Lab Results  Component Value Date   RETICCTPCT 1.3 05/26/2009   RBC 5.08 12/10/2018   No results found for: KPAFRELGTCHN, LAMBDASER, KAPLAMBRATIO No results found for: IGGSERUM, IGA, IGMSERUM No results found for: TOTALPROTELP,  Ronney Lion, SPEI   Chemistry      Component Value Date/Time   NA 141 12/10/2018 1119   K 3.6 12/10/2018 1119   CL 104 12/10/2018 1119   CO2 30 12/10/2018 1119   BUN 17 12/10/2018 1119   CREATININE 0.90 12/10/2018 1119      Component Value Date/Time   CALCIUM 10.3 12/10/2018 1119   ALKPHOS 79 12/10/2018 1119   AST 13 (L) 12/10/2018 1119   ALT 13 12/10/2018 1119   BILITOT 0.4 12/10/2018 1119       Impression and Plan: Laura Campbell is a very pleasant 58 yo African American female with history of  recurrent DVT/PE as well as Protein C and S deficiencies.  Her initial blood clot was over 10 years ago. Her recurrent clot was diagnosed in 2018.  She continues to do well on lifelong anticoagulation with Eliquis 2.5 mg PO BID.  We will see her back in another 6 months.  She will contact our office with any questions or concerns. We can certainly see her sooner if needed.   Laverna Peace, NP 9/16/20201:27 PM

## 2019-06-24 LAB — PROTEIN S PANEL
Protein S Activity: 37 % — ABNORMAL LOW (ref 63–140)
Protein S Ag, Free: 27 % — ABNORMAL LOW (ref 57–157)
Protein S Ag, Total: 76 % (ref 60–150)

## 2019-06-24 LAB — PROTEIN C, TOTAL: Protein C, Total: 58 % — ABNORMAL LOW (ref 60–150)

## 2019-06-24 LAB — PROTEIN C ACTIVITY: Protein C Activity: 65 % — ABNORMAL LOW (ref 73–180)

## 2019-07-01 DIAGNOSIS — Z23 Encounter for immunization: Secondary | ICD-10-CM | POA: Diagnosis not present

## 2019-07-01 DIAGNOSIS — G894 Chronic pain syndrome: Secondary | ICD-10-CM | POA: Diagnosis not present

## 2019-07-01 DIAGNOSIS — E1169 Type 2 diabetes mellitus with other specified complication: Secondary | ICD-10-CM | POA: Diagnosis not present

## 2019-07-01 DIAGNOSIS — E6609 Other obesity due to excess calories: Secondary | ICD-10-CM | POA: Diagnosis not present

## 2019-07-01 DIAGNOSIS — E785 Hyperlipidemia, unspecified: Secondary | ICD-10-CM | POA: Diagnosis not present

## 2019-07-01 DIAGNOSIS — I10 Essential (primary) hypertension: Secondary | ICD-10-CM | POA: Diagnosis not present

## 2019-07-01 DIAGNOSIS — D6851 Activated protein C resistance: Secondary | ICD-10-CM | POA: Diagnosis not present

## 2019-07-05 DIAGNOSIS — M5416 Radiculopathy, lumbar region: Secondary | ICD-10-CM | POA: Diagnosis not present

## 2019-07-05 DIAGNOSIS — M545 Low back pain: Secondary | ICD-10-CM | POA: Diagnosis not present

## 2019-07-15 DIAGNOSIS — I10 Essential (primary) hypertension: Secondary | ICD-10-CM | POA: Diagnosis not present

## 2019-07-15 DIAGNOSIS — E785 Hyperlipidemia, unspecified: Secondary | ICD-10-CM | POA: Diagnosis not present

## 2019-07-15 DIAGNOSIS — G8929 Other chronic pain: Secondary | ICD-10-CM | POA: Diagnosis not present

## 2019-07-15 DIAGNOSIS — D6859 Other primary thrombophilia: Secondary | ICD-10-CM | POA: Diagnosis not present

## 2019-07-22 DIAGNOSIS — M545 Low back pain: Secondary | ICD-10-CM | POA: Diagnosis not present

## 2019-07-22 DIAGNOSIS — M5416 Radiculopathy, lumbar region: Secondary | ICD-10-CM | POA: Diagnosis not present

## 2019-08-05 DIAGNOSIS — M7061 Trochanteric bursitis, right hip: Secondary | ICD-10-CM | POA: Diagnosis not present

## 2019-11-21 DIAGNOSIS — E1169 Type 2 diabetes mellitus with other specified complication: Secondary | ICD-10-CM | POA: Diagnosis not present

## 2019-11-21 DIAGNOSIS — Z86718 Personal history of other venous thrombosis and embolism: Secondary | ICD-10-CM | POA: Diagnosis not present

## 2019-11-21 DIAGNOSIS — I10 Essential (primary) hypertension: Secondary | ICD-10-CM | POA: Diagnosis not present

## 2019-11-21 DIAGNOSIS — Z7984 Long term (current) use of oral hypoglycemic drugs: Secondary | ICD-10-CM | POA: Diagnosis not present

## 2019-11-21 DIAGNOSIS — Z7901 Long term (current) use of anticoagulants: Secondary | ICD-10-CM | POA: Diagnosis not present

## 2019-11-21 DIAGNOSIS — E782 Mixed hyperlipidemia: Secondary | ICD-10-CM | POA: Diagnosis not present

## 2019-11-21 DIAGNOSIS — E119 Type 2 diabetes mellitus without complications: Secondary | ICD-10-CM | POA: Diagnosis not present

## 2019-11-21 DIAGNOSIS — Z86711 Personal history of pulmonary embolism: Secondary | ICD-10-CM | POA: Diagnosis not present

## 2019-11-21 DIAGNOSIS — E785 Hyperlipidemia, unspecified: Secondary | ICD-10-CM | POA: Diagnosis not present

## 2019-12-16 DIAGNOSIS — I10 Essential (primary) hypertension: Secondary | ICD-10-CM | POA: Diagnosis not present

## 2019-12-16 DIAGNOSIS — E1169 Type 2 diabetes mellitus with other specified complication: Secondary | ICD-10-CM | POA: Diagnosis not present

## 2019-12-16 DIAGNOSIS — Z6841 Body Mass Index (BMI) 40.0 and over, adult: Secondary | ICD-10-CM | POA: Diagnosis not present

## 2019-12-16 DIAGNOSIS — E782 Mixed hyperlipidemia: Secondary | ICD-10-CM | POA: Diagnosis not present

## 2019-12-16 DIAGNOSIS — D6851 Activated protein C resistance: Secondary | ICD-10-CM | POA: Diagnosis not present

## 2019-12-20 ENCOUNTER — Inpatient Hospital Stay: Payer: Medicare HMO

## 2019-12-20 ENCOUNTER — Inpatient Hospital Stay: Payer: Medicare HMO | Admitting: Family

## 2020-02-24 DIAGNOSIS — M7062 Trochanteric bursitis, left hip: Secondary | ICD-10-CM | POA: Diagnosis not present

## 2020-03-15 DIAGNOSIS — M7061 Trochanteric bursitis, right hip: Secondary | ICD-10-CM | POA: Diagnosis not present

## 2020-04-02 ENCOUNTER — Other Ambulatory Visit: Payer: Self-pay | Admitting: Family Medicine

## 2020-04-02 DIAGNOSIS — Z1231 Encounter for screening mammogram for malignant neoplasm of breast: Secondary | ICD-10-CM

## 2020-04-06 DIAGNOSIS — M5416 Radiculopathy, lumbar region: Secondary | ICD-10-CM | POA: Diagnosis not present

## 2020-04-10 ENCOUNTER — Telehealth: Payer: Self-pay

## 2020-04-10 NOTE — Telephone Encounter (Signed)
Per Judson Roch, patient okay to go on her flight/trip. Called patient and informed her. Patient denies any other questions or concerns currently.

## 2020-04-10 NOTE — Telephone Encounter (Signed)
Patient inquiring if it is okay for her to fly to [place], states it is an hour and a half flight. No layovers, direct flight. And she is still taking her Eliquis twice daily. Will send to Sarah for further advice and call patient back.

## 2020-04-11 DIAGNOSIS — E1169 Type 2 diabetes mellitus with other specified complication: Secondary | ICD-10-CM | POA: Diagnosis not present

## 2020-04-11 DIAGNOSIS — I10 Essential (primary) hypertension: Secondary | ICD-10-CM | POA: Diagnosis not present

## 2020-04-11 DIAGNOSIS — D6859 Other primary thrombophilia: Secondary | ICD-10-CM | POA: Diagnosis not present

## 2020-04-11 DIAGNOSIS — Z6841 Body Mass Index (BMI) 40.0 and over, adult: Secondary | ICD-10-CM | POA: Diagnosis not present

## 2020-04-11 DIAGNOSIS — E782 Mixed hyperlipidemia: Secondary | ICD-10-CM | POA: Diagnosis not present

## 2020-04-12 ENCOUNTER — Ambulatory Visit
Admission: RE | Admit: 2020-04-12 | Discharge: 2020-04-12 | Disposition: A | Discharge status: Home / Self Care | Payer: Medicare HMO | Source: Ambulatory Visit | Attending: Family Medicine | Admitting: Family Medicine

## 2020-04-12 ENCOUNTER — Other Ambulatory Visit: Payer: Self-pay

## 2020-04-12 DIAGNOSIS — Z1231 Encounter for screening mammogram for malignant neoplasm of breast: Secondary | ICD-10-CM | POA: Diagnosis not present

## 2020-08-04 DIAGNOSIS — E118 Type 2 diabetes mellitus with unspecified complications: Secondary | ICD-10-CM | POA: Diagnosis not present

## 2020-08-04 DIAGNOSIS — I10 Essential (primary) hypertension: Secondary | ICD-10-CM | POA: Diagnosis not present

## 2020-08-04 DIAGNOSIS — E785 Hyperlipidemia, unspecified: Secondary | ICD-10-CM | POA: Diagnosis not present

## 2020-08-29 DIAGNOSIS — E1169 Type 2 diabetes mellitus with other specified complication: Secondary | ICD-10-CM | POA: Diagnosis not present

## 2020-08-29 DIAGNOSIS — I10 Essential (primary) hypertension: Secondary | ICD-10-CM | POA: Diagnosis not present

## 2020-08-29 DIAGNOSIS — M7061 Trochanteric bursitis, right hip: Secondary | ICD-10-CM | POA: Diagnosis not present

## 2020-08-29 DIAGNOSIS — G894 Chronic pain syndrome: Secondary | ICD-10-CM | POA: Diagnosis not present

## 2020-08-29 DIAGNOSIS — E7020 Disorder of tyrosine metabolism, unspecified: Secondary | ICD-10-CM | POA: Diagnosis not present

## 2020-09-27 DIAGNOSIS — M7062 Trochanteric bursitis, left hip: Secondary | ICD-10-CM | POA: Diagnosis not present

## 2020-11-12 DIAGNOSIS — M7062 Trochanteric bursitis, left hip: Secondary | ICD-10-CM | POA: Diagnosis not present

## 2020-11-21 DIAGNOSIS — E782 Mixed hyperlipidemia: Secondary | ICD-10-CM | POA: Diagnosis not present

## 2020-11-21 DIAGNOSIS — E785 Hyperlipidemia, unspecified: Secondary | ICD-10-CM | POA: Diagnosis not present

## 2020-11-21 DIAGNOSIS — E1169 Type 2 diabetes mellitus with other specified complication: Secondary | ICD-10-CM | POA: Diagnosis not present

## 2020-11-21 DIAGNOSIS — I10 Essential (primary) hypertension: Secondary | ICD-10-CM | POA: Diagnosis not present

## 2020-11-26 DIAGNOSIS — Z6841 Body Mass Index (BMI) 40.0 and over, adult: Secondary | ICD-10-CM | POA: Diagnosis not present

## 2020-11-26 DIAGNOSIS — G8929 Other chronic pain: Secondary | ICD-10-CM | POA: Diagnosis not present

## 2020-11-26 DIAGNOSIS — E1165 Type 2 diabetes mellitus with hyperglycemia: Secondary | ICD-10-CM | POA: Diagnosis not present

## 2020-11-26 DIAGNOSIS — E1142 Type 2 diabetes mellitus with diabetic polyneuropathy: Secondary | ICD-10-CM | POA: Diagnosis not present

## 2020-11-26 DIAGNOSIS — R609 Edema, unspecified: Secondary | ICD-10-CM | POA: Diagnosis not present

## 2020-11-26 DIAGNOSIS — Z823 Family history of stroke: Secondary | ICD-10-CM | POA: Diagnosis not present

## 2020-11-26 DIAGNOSIS — Z7984 Long term (current) use of oral hypoglycemic drugs: Secondary | ICD-10-CM | POA: Diagnosis not present

## 2020-11-26 DIAGNOSIS — Z7901 Long term (current) use of anticoagulants: Secondary | ICD-10-CM | POA: Diagnosis not present

## 2020-11-26 DIAGNOSIS — I10 Essential (primary) hypertension: Secondary | ICD-10-CM | POA: Diagnosis not present

## 2020-12-03 DIAGNOSIS — I10 Essential (primary) hypertension: Secondary | ICD-10-CM | POA: Diagnosis not present

## 2020-12-03 DIAGNOSIS — E118 Type 2 diabetes mellitus with unspecified complications: Secondary | ICD-10-CM | POA: Diagnosis not present

## 2020-12-03 DIAGNOSIS — E785 Hyperlipidemia, unspecified: Secondary | ICD-10-CM | POA: Diagnosis not present

## 2021-01-01 DIAGNOSIS — M48061 Spinal stenosis, lumbar region without neurogenic claudication: Secondary | ICD-10-CM | POA: Diagnosis not present

## 2021-01-01 DIAGNOSIS — M5416 Radiculopathy, lumbar region: Secondary | ICD-10-CM | POA: Diagnosis not present

## 2021-01-03 DIAGNOSIS — I10 Essential (primary) hypertension: Secondary | ICD-10-CM | POA: Diagnosis not present

## 2021-01-03 DIAGNOSIS — E785 Hyperlipidemia, unspecified: Secondary | ICD-10-CM | POA: Diagnosis not present

## 2021-01-03 DIAGNOSIS — E1169 Type 2 diabetes mellitus with other specified complication: Secondary | ICD-10-CM | POA: Diagnosis not present

## 2021-01-14 DIAGNOSIS — M5416 Radiculopathy, lumbar region: Secondary | ICD-10-CM | POA: Diagnosis not present

## 2021-02-04 DIAGNOSIS — M48061 Spinal stenosis, lumbar region without neurogenic claudication: Secondary | ICD-10-CM | POA: Diagnosis not present

## 2021-02-04 DIAGNOSIS — M5416 Radiculopathy, lumbar region: Secondary | ICD-10-CM | POA: Diagnosis not present

## 2021-03-25 DIAGNOSIS — M25512 Pain in left shoulder: Secondary | ICD-10-CM | POA: Diagnosis not present

## 2021-04-04 DIAGNOSIS — E785 Hyperlipidemia, unspecified: Secondary | ICD-10-CM | POA: Diagnosis not present

## 2021-04-04 DIAGNOSIS — E118 Type 2 diabetes mellitus with unspecified complications: Secondary | ICD-10-CM | POA: Diagnosis not present

## 2021-04-04 DIAGNOSIS — I10 Essential (primary) hypertension: Secondary | ICD-10-CM | POA: Diagnosis not present

## 2021-05-06 ENCOUNTER — Other Ambulatory Visit: Payer: Self-pay

## 2021-05-07 ENCOUNTER — Encounter: Payer: Self-pay | Admitting: Family Medicine

## 2021-05-07 ENCOUNTER — Ambulatory Visit (INDEPENDENT_AMBULATORY_CARE_PROVIDER_SITE_OTHER): Payer: Medicare Other | Admitting: Family Medicine

## 2021-05-07 VITALS — BP 130/84 | HR 64 | Temp 97.8°F | Ht 61.0 in | Wt 231.0 lb

## 2021-05-07 DIAGNOSIS — Z23 Encounter for immunization: Secondary | ICD-10-CM | POA: Diagnosis not present

## 2021-05-07 DIAGNOSIS — Z86718 Personal history of other venous thrombosis and embolism: Secondary | ICD-10-CM | POA: Diagnosis not present

## 2021-05-07 DIAGNOSIS — E785 Hyperlipidemia, unspecified: Secondary | ICD-10-CM | POA: Insufficient documentation

## 2021-05-07 DIAGNOSIS — M545 Low back pain, unspecified: Secondary | ICD-10-CM

## 2021-05-07 DIAGNOSIS — Z86711 Personal history of pulmonary embolism: Secondary | ICD-10-CM | POA: Insufficient documentation

## 2021-05-07 DIAGNOSIS — M48061 Spinal stenosis, lumbar region without neurogenic claudication: Secondary | ICD-10-CM | POA: Insufficient documentation

## 2021-05-07 DIAGNOSIS — E119 Type 2 diabetes mellitus without complications: Secondary | ICD-10-CM | POA: Diagnosis not present

## 2021-05-07 DIAGNOSIS — Z1211 Encounter for screening for malignant neoplasm of colon: Secondary | ICD-10-CM

## 2021-05-07 DIAGNOSIS — I10 Essential (primary) hypertension: Secondary | ICD-10-CM | POA: Insufficient documentation

## 2021-05-07 DIAGNOSIS — M5416 Radiculopathy, lumbar region: Secondary | ICD-10-CM | POA: Insufficient documentation

## 2021-05-07 DIAGNOSIS — G8929 Other chronic pain: Secondary | ICD-10-CM | POA: Insufficient documentation

## 2021-05-07 MED ORDER — METFORMIN HCL 500 MG PO TABS: 500.0000 mg | ORAL_TABLET | Freq: Every day | ORAL | 3 refills | Status: DC

## 2021-05-07 MED ORDER — SPIRONOLACTONE 25 MG PO TABS: 25.0000 mg | ORAL_TABLET | Freq: Every morning | ORAL | 3 refills | Status: DC

## 2021-05-07 MED ORDER — ROSUVASTATIN CALCIUM 5 MG PO TABS: 5.0000 mg | ORAL_TABLET | Freq: Every day | ORAL | 3 refills | Status: DC

## 2021-05-07 MED ORDER — HYDROCHLOROTHIAZIDE 25 MG PO TABS: 25.0000 mg | ORAL_TABLET | Freq: Every day | ORAL | 3 refills | Status: DC

## 2021-05-07 MED ORDER — AMLODIPINE BESYLATE 10 MG PO TABS: 10.0000 mg | ORAL_TABLET | Freq: Every day | ORAL | 3 refills | Status: DC

## 2021-05-07 MED ORDER — TELMISARTAN 80 MG PO TABS: 80.0000 mg | ORAL_TABLET | Freq: Every day | ORAL | 3 refills | Status: DC

## 2021-05-07 NOTE — Addendum Note (Signed)
Addended by: Beryle Lathe S on: 05/07/2021 12:15 PM   Modules accepted: Orders
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
## 2021-05-07 NOTE — Patient Instructions (Signed)
Schedule annual dilated eye exam

## 2021-05-13 ENCOUNTER — Other Ambulatory Visit (INDEPENDENT_AMBULATORY_CARE_PROVIDER_SITE_OTHER): Payer: Medicare Other

## 2021-05-13 ENCOUNTER — Other Ambulatory Visit: Payer: Self-pay

## 2021-05-13 DIAGNOSIS — E119 Type 2 diabetes mellitus without complications: Secondary | ICD-10-CM

## 2021-05-13 DIAGNOSIS — E785 Hyperlipidemia, unspecified: Secondary | ICD-10-CM | POA: Diagnosis not present

## 2021-05-13 LAB — LIPID PANEL
Cholesterol: 149 mg/dL (ref 0–200)
HDL: 53.5 mg/dL (ref >39.00)
LDL Cholesterol: 81 mg/dL (ref 0–99)
NonHDL: 95.22
Total CHOL/HDL Ratio: 3
Triglycerides: 71 mg/dL (ref 0.0–149.0)
VLDL: 14.2 mg/dL (ref 0.0–40.0)

## 2021-05-13 LAB — URINALYSIS, ROUTINE W REFLEX MICROSCOPIC
Bilirubin Urine: NEGATIVE
Hgb urine dipstick: NEGATIVE
Ketones, ur: NEGATIVE
Leukocytes,Ua: NEGATIVE
Nitrite: NEGATIVE
RBC / HPF: NONE SEEN (ref 0–?)
Specific Gravity, Urine: 1.01 (ref 1.000–1.030)
Total Protein, Urine: NEGATIVE
Urine Glucose: NEGATIVE
Urobilinogen, UA: 0.2 (ref 0.0–1.0)
pH: 5.5 (ref 5.0–8.0)

## 2021-05-13 LAB — BASIC METABOLIC PANEL
BUN: 25 mg/dL — ABNORMAL HIGH (ref 6–23)
CO2: 26 mEq/L (ref 19–32)
Calcium: 10.1 mg/dL (ref 8.4–10.5)
Chloride: 98 mEq/L (ref 96–112)
Creatinine, Ser: 1.07 mg/dL (ref 0.40–1.20)
GFR: 55.71 mL/min — ABNORMAL LOW (ref >60.00)
Glucose, Bld: 139 mg/dL — ABNORMAL HIGH (ref 70–99)
Potassium: 4 mEq/L (ref 3.5–5.1)
Sodium: 135 mEq/L (ref 135–145)

## 2021-05-13 LAB — MICROALBUMIN / CREATININE URINE RATIO
Creatinine,U: 62.2 mg/dL
Microalb Creat Ratio: 1.1 mg/g (ref 0.0–30.0)
Microalb, Ur: <0.7 mg/dL (ref 0.0–1.9)

## 2021-05-13 LAB — HEMOGLOBIN A1C: Hgb A1c MFr Bld: 8.9 % — ABNORMAL HIGH (ref 4.6–6.5)

## 2021-05-13 MED ORDER — METFORMIN HCL 500 MG PO TABS: 500.0000 mg | ORAL_TABLET | Freq: Two times a day (BID) | ORAL | 3 refills | Status: DC

## 2021-05-13 NOTE — Addendum Note (Signed)
Addended by: Haydee Salter on: 05/13/2021 02:35 PM   Modules accepted: Orders

## 2021-06-02 LAB — COLOGUARD: Cologuard: POSITIVE — AB

## 2021-06-03 ENCOUNTER — Other Ambulatory Visit: Payer: Self-pay | Admitting: Family Medicine

## 2021-06-03 DIAGNOSIS — R195 Other fecal abnormalities: Secondary | ICD-10-CM | POA: Insufficient documentation

## 2021-06-03 DIAGNOSIS — Z1211 Encounter for screening for malignant neoplasm of colon: Secondary | ICD-10-CM

## 2021-06-03 NOTE — Progress Notes (Signed)
Unable to leave VM due to mailbox is full. Will try later.  Dm/cma  

## 2021-06-03 NOTE — Progress Notes (Signed)
The Cologuard result was positive. Patient needs referral for a colonoscopy to further assess. I will place the referral.   Laura Salter, MD

## 2021-06-04 NOTE — Progress Notes (Signed)
Spoke to patient and advised of results and referral for colonoscopy that was placed.  No questions.  Dm/cma

## 2021-06-17 ENCOUNTER — Encounter: Payer: Self-pay | Admitting: Family Medicine

## 2021-06-17 ENCOUNTER — Telehealth: Payer: Self-pay | Admitting: Family Medicine

## 2021-06-17 DIAGNOSIS — R195 Other fecal abnormalities: Secondary | ICD-10-CM

## 2021-06-17 NOTE — Telephone Encounter (Signed)
Pt is needing a new referral sent to [facility], the one that had been sent has been closed out. Please advise pt at [phone].

## 2021-06-20 ENCOUNTER — Ambulatory Visit (INDEPENDENT_AMBULATORY_CARE_PROVIDER_SITE_OTHER): Payer: Medicare Other | Admitting: *Deleted

## 2021-06-20 DIAGNOSIS — Z780 Asymptomatic menopausal state: Secondary | ICD-10-CM

## 2021-06-20 DIAGNOSIS — Z0000 Encounter for general adult medical examination without abnormal findings: Secondary | ICD-10-CM | POA: Diagnosis not present

## 2021-06-20 DIAGNOSIS — Z1211 Encounter for screening for malignant neoplasm of colon: Secondary | ICD-10-CM | POA: Diagnosis not present

## 2021-06-20 NOTE — Telephone Encounter (Signed)
Called and spoke to patient. Patient stated she requested records with previous colonoscopy results to be faxed to our office and has not heard from referral coordinator. Explained to patient, I will request records again and contact referral coordinator on her behalf. Sw, cma

## 2021-06-20 NOTE — Patient Instructions (Signed)
[name] , Thank you for taking time to come for your Medicare Wellness Visit. I appreciate your ongoing commitment to your health goals. Please review the following plan we discussed and let me know if I can assist you in the future.   Screening recommendations/referrals: Colonoscopy: Education provided Mammogram: Education provided Bone Density: Education provided Recommended yearly ophthalmology/optometry visit for glaucoma screening and checkup Recommended yearly dental visit for hygiene and checkup  Vaccinations: Influenza vaccine: Education provided Pneumococcal vaccine: Education provided Tdap vaccine: up to date Shingles vaccine: Education provided    Advanced directives: Education provided  Conditions/risks identified:   Next appointment: 08-07-2021 @ 10:30 am  Dr. Gena Fray   Preventive Care 65 Years and Older, Female Preventive care refers to lifestyle choices and visits with your health care provider that can promote health and wellness. What does preventive care include? A yearly physical exam. This is also called an annual well check. Dental exams once or twice a year. Routine eye exams. Ask your health care provider how often you should have your eyes checked. Personal lifestyle choices, including: Daily care of your teeth and gums. Regular physical activity. Eating a healthy diet. Avoiding tobacco and drug use. Limiting alcohol use. Practicing safe sex. Taking low-dose aspirin every day. Taking vitamin and mineral supplements as recommended by your health care provider. What happens during an annual well check? The services and screenings done by your health care provider during your annual well check will depend on your age, overall health, lifestyle risk factors, and family history of disease. Counseling  Your health care provider may ask you questions about your: Alcohol use. Tobacco use. Drug use. Emotional well-being. Home and relationship  well-being. Sexual activity. Eating habits. History of falls. Memory and ability to understand (cognition). Work and work Statistician. Reproductive health. Screening  You may have the following tests or measurements: Height, weight, and BMI. Blood pressure. Lipid and cholesterol levels. These may be checked every 5 years, or more frequently if you are over 39 years old. Skin check. Lung cancer screening. You may have this screening every year starting at age 18 if you have a 30-pack-year history of smoking and currently smoke or have quit within the past 15 years. Fecal occult blood test (FOBT) of the stool. You may have this test every year starting at age 43. Flexible sigmoidoscopy or colonoscopy. You may have a sigmoidoscopy every 5 years or a colonoscopy every 10 years starting at age 43. Hepatitis C blood test. Hepatitis B blood test. Sexually transmitted disease (STD) testing. Diabetes screening. This is done by checking your blood sugar (glucose) after you have not eaten for a while (fasting). You may have this done every 1-3 years. Bone density scan. This is done to screen for osteoporosis. You may have this done starting at age 76. Mammogram. This may be done every 1-2 years. Talk to your health care provider about how often you should have regular mammograms. Talk with your health care provider about your test results, treatment options, and if necessary, the need for more tests. Vaccines  Your health care provider may recommend certain vaccines, such as: Influenza vaccine. This is recommended every year. Tetanus, diphtheria, and acellular pertussis (Tdap, Td) vaccine. You may need a Td booster every 10 years. Zoster vaccine. You may need this after age 14. Pneumococcal 13-valent conjugate (PCV13) vaccine. One dose is recommended after age 36. Pneumococcal polysaccharide (PPSV23) vaccine. One dose is recommended after age 45. Talk to your health care provider about which  screenings and vaccines you need and how often you need them. This information is not intended to replace advice given to you by your health care provider. Make sure you discuss any questions you have with your health care provider. Document Released: 10/19/2015 Document Revised: 06/11/2016 Document Reviewed: 07/24/2015 Elsevier Interactive Patient Education  2017 [place] Prevention in the Home Falls can cause injuries. They can happen to people of all ages. There are many things you can do to make your home safe and to help prevent falls. What can I do on the outside of my home? Regularly fix the edges of walkways and driveways and fix any cracks. Remove anything that might make you trip as you walk through a door, such as a raised step or threshold. Trim any bushes or trees on the path to your home. Use bright outdoor lighting. Clear any walking paths of anything that might make someone trip, such as rocks or tools. Regularly check to see if handrails are loose or broken. Make sure that both sides of any steps have handrails. Any raised decks and porches should have guardrails on the edges. Have any leaves, snow, or ice cleared regularly. Use sand or salt on walking paths during winter. Clean up any spills in your garage right away. This includes oil or grease spills. What can I do in the bathroom? Use night lights. Install grab bars by the toilet and in the tub and shower. Do not use towel bars as grab bars. Use non-skid mats or decals in the tub or shower. If you need to sit down in the shower, use a plastic, non-slip stool. Keep the floor dry. Clean up any water that spills on the floor as soon as it happens. Remove soap buildup in the tub or shower regularly. Attach bath mats securely with double-sided non-slip rug tape. Do not have throw rugs and other things on the floor that can make you trip. What can I do in the bedroom? Use night lights. Make sure that you have a  light by your bed that is easy to reach. Do not use any sheets or blankets that are too big for your bed. They should not hang down onto the floor. Have a firm chair that has side arms. You can use this for support while you get dressed. Do not have throw rugs and other things on the floor that can make you trip. What can I do in the kitchen? Clean up any spills right away. Avoid walking on wet floors. Keep items that you use a lot in easy-to-reach places. If you need to reach something above you, use a strong step stool that has a grab bar. Keep electrical cords out of the way. Do not use floor polish or wax that makes floors slippery. If you must use wax, use non-skid floor wax. Do not have throw rugs and other things on the floor that can make you trip. What can I do with my stairs? Do not leave any items on the stairs. Make sure that there are handrails on both sides of the stairs and use them. Fix handrails that are broken or loose. Make sure that handrails are as long as the stairways. Check any carpeting to make sure that it is firmly attached to the stairs. Fix any carpet that is loose or worn. Avoid having throw rugs at the top or bottom of the stairs. If you do have throw rugs, attach them to the floor with carpet tape.  Make sure that you have a light switch at the top of the stairs and the bottom of the stairs. If you do not have them, ask someone to add them for you. What else can I do to help prevent falls? Wear shoes that: Do not have high heels. Have rubber bottoms. Are comfortable and fit you well. Are closed at the toe. Do not wear sandals. If you use a stepladder: Make sure that it is fully opened. Do not climb a closed stepladder. Make sure that both sides of the stepladder are locked into place. Ask someone to hold it for you, if possible. Clearly mark and make sure that you can see: Any grab bars or handrails. First and last steps. Where the edge of each step  is. Use tools that help you move around (mobility aids) if they are needed. These include: Canes. Walkers. Scooters. Crutches. Turn on the lights when you go into a dark area. Replace any light bulbs as soon as they burn out. Set up your furniture so you have a clear path. Avoid moving your furniture around. If any of your floors are uneven, fix them. If there are any pets around you, be aware of where they are. Review your medicines with your doctor. Some medicines can make you feel dizzy. This can increase your chance of falling. Ask your doctor what other things that you can do to help prevent falls. This information is not intended to replace advice given to you by your health care provider. Make sure you discuss any questions you have with your health care provider. Document Released: 07/19/2009 Document Revised: 02/28/2016 Document Reviewed: 10/27/2014 Elsevier Interactive Patient Education  2017 [employer].
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
## 2021-06-27 ENCOUNTER — Telehealth: Payer: Self-pay | Admitting: Gastroenterology

## 2021-06-27 NOTE — Telephone Encounter (Signed)
Hey Dr. Candis Schatz,   We received a transfer of care for positive cologuard. Patient had colon in 2015. We have records and will send for review. Could you please review and advise on scheduling?  Thank you

## 2021-07-01 NOTE — Telephone Encounter (Signed)
Left vm to return call.    

## 2021-07-16 NOTE — Telephone Encounter (Signed)
Pt is wanting her colonoscopy referral to go through. Please advise pt at [phone]. She is really wanting to get this procedure done.

## 2021-07-17 NOTE — Telephone Encounter (Signed)
Called Dr. Carol [facility] specialist office and spoke to office rep. Rep stated patient will need to sign a medical release form in order for [facility] to receive past colonoscopy results. Referral coordinator will reach out to [name]. Sw, cma

## 2021-07-18 ENCOUNTER — Other Ambulatory Visit: Payer: Self-pay | Admitting: Family Medicine

## 2021-07-18 ENCOUNTER — Ambulatory Visit
Admission: RE | Admit: 2021-07-18 | Discharge: 2021-07-18 | Disposition: A | Discharge status: Home / Self Care | Payer: Medicare Other | Source: Ambulatory Visit | Attending: Family Medicine | Admitting: Family Medicine

## 2021-07-18 ENCOUNTER — Other Ambulatory Visit: Payer: Self-pay

## 2021-07-18 DIAGNOSIS — Z780 Asymptomatic menopausal state: Secondary | ICD-10-CM | POA: Diagnosis not present

## 2021-07-18 DIAGNOSIS — Z1231 Encounter for screening mammogram for malignant neoplasm of breast: Secondary | ICD-10-CM | POA: Diagnosis not present

## 2021-07-22 ENCOUNTER — Other Ambulatory Visit: Payer: Self-pay

## 2021-07-22 ENCOUNTER — Telehealth: Payer: Self-pay | Admitting: Family Medicine

## 2021-07-22 DIAGNOSIS — Z86718 Personal history of other venous thrombosis and embolism: Secondary | ICD-10-CM

## 2021-07-22 DIAGNOSIS — D6859 Other primary thrombophilia: Secondary | ICD-10-CM

## 2021-07-22 DIAGNOSIS — E119 Type 2 diabetes mellitus without complications: Secondary | ICD-10-CM

## 2021-07-22 DIAGNOSIS — Z86711 Personal history of pulmonary embolism: Secondary | ICD-10-CM

## 2021-07-22 MED ORDER — APIXABAN 2.5 MG PO TABS: 2.5000 mg | ORAL_TABLET | Freq: Two times a day (BID) | ORAL | 11 refills | Status: DC

## 2021-07-22 MED ORDER — METFORMIN HCL 500 MG PO TABS: 500.0000 mg | ORAL_TABLET | Freq: Two times a day (BID) | ORAL | 3 refills | Status: DC

## 2021-07-22 NOTE — Telephone Encounter (Signed)
Pt requesting refill on apixaban (ELIQUIS) 2.5 MG TABS tablet [[id number]]

## 2021-07-22 NOTE — Telephone Encounter (Signed)
Refill request for:  Eliquis 2.5mg   LR  HX provider ? LOV 05/07/21 FOV  08/07/21  Please review and advise.  Thanks. Dm/cma

## 2021-07-23 NOTE — Telephone Encounter (Signed)
Patient notified VIA phone and has already picked it up. Dm/cma

## 2021-08-07 ENCOUNTER — Ambulatory Visit: Payer: Medicare Other | Admitting: Family Medicine

## 2021-08-10 DIAGNOSIS — E119 Type 2 diabetes mellitus without complications: Secondary | ICD-10-CM | POA: Diagnosis not present

## 2021-08-27 ENCOUNTER — Other Ambulatory Visit: Payer: Self-pay

## 2021-08-28 ENCOUNTER — Encounter: Payer: Self-pay | Admitting: Family Medicine

## 2021-08-28 ENCOUNTER — Ambulatory Visit (INDEPENDENT_AMBULATORY_CARE_PROVIDER_SITE_OTHER): Payer: Medicare Other | Admitting: Family Medicine

## 2021-08-28 VITALS — BP 134/82 | HR 59 | Temp 97.4°F | Ht 61.0 in | Wt 222.2 lb

## 2021-08-28 DIAGNOSIS — E785 Hyperlipidemia, unspecified: Secondary | ICD-10-CM

## 2021-08-28 DIAGNOSIS — I10 Essential (primary) hypertension: Secondary | ICD-10-CM | POA: Diagnosis not present

## 2021-08-28 DIAGNOSIS — Z1159 Encounter for screening for other viral diseases: Secondary | ICD-10-CM | POA: Diagnosis not present

## 2021-08-28 DIAGNOSIS — Z23 Encounter for immunization: Secondary | ICD-10-CM

## 2021-08-28 DIAGNOSIS — Z86718 Personal history of other venous thrombosis and embolism: Secondary | ICD-10-CM

## 2021-08-28 DIAGNOSIS — E119 Type 2 diabetes mellitus without complications: Secondary | ICD-10-CM

## 2021-08-28 DIAGNOSIS — Z114 Encounter for screening for human immunodeficiency virus [HIV]: Secondary | ICD-10-CM

## 2021-08-28 NOTE — Progress Notes (Signed)
[facility] [address] [place] [address] Dept: [phone] Dept Fax: [phone]  Chronic Care Office Visit  Subjective:    Patient ID: Laura Campbell, female    DOB: May 20, 1961, 60 y.o..   MRN: [id number]  Chief Complaint  Patient presents with   Follow-up    3 month f/u.  No other concerns     History of Present Illness:  Patient is in today for reassessment of chronic medical issues.  Ms. Ast has a history of recurrent DVT and a prior PE. She is currently on Eliquis and is to remain on this lifelong.   Ms. Gary has a history of Type 2 diabetes. She is managed on metformin. She had her eye exam earlier this month and was not noted to have any issues.    Ms. Luallen has a history of hypertension that has been difficult to control. She is currently on amlodipine, HCTZ, spironolactone, and telmisartan. She did not take her medicine today, as she had a deep dental cleaning.   Ms. King has a history of hyperlipidemia and is managed on Crestor.  Past Medical History: Patient Active Problem List   Diagnosis Date Noted   Abnormal stool test- Positive Cologuard 06/03/2021   Lumbar radiculopathy, chronic 05/07/2021   Spinal stenosis, lumbar region without neurogenic claudication 05/07/2021   Essential hypertension 05/07/2021   History of DVT (deep vein thrombosis) 05/07/2021   History of pulmonary embolus (PE) 05/07/2021   Hyperlipidemia 05/07/2021   Chronic low back pain 05/07/2021   Hereditary and idiopathic peripheral neuropathy 03/27/2015   Left foot pain 03/27/2015   Type 2 diabetes mellitus ([place]) 03/27/2015   Past Surgical History:  Procedure Laterality Date   ABDOMINAL HYSTERECTOMY  10/06/2008   TONSILLECTOMY     Family History  Problem Relation Age of Onset   Heart disease Mother    Cirrhosis Father    Stroke Sister    Diabetes Sister    Cancer Sister        Breast   Breast cancer  Maternal Aunt    Breast cancer Cousin    Neuropathy Neg Hx     Outpatient Medications Prior to Visit  Medication Sig Dispense Refill   amLODipine (NORVASC) 10 MG tablet Take 1 tablet (10 mg total) by mouth daily. 90 tablet 3   apixaban (ELIQUIS) 2.5 MG TABS tablet Take 1 tablet (2.5 mg total) by mouth 2 (two) times daily. 60 tablet 11   Blood Glucose Monitoring Suppl (ONE TOUCH ULTRA MINI) W/DEVICE KIT      hydrochlorothiazide (HYDRODIURIL) 25 MG tablet Take 1 tablet (25 mg total) by mouth daily. 90 tablet 3   metFORMIN (GLUCOPHAGE) 500 MG tablet Take 1 tablet (500 mg total) by mouth 2 (two) times daily with a meal. 180 tablet 3   ONE TOUCH ULTRA TEST test strip      rosuvastatin (CRESTOR) 5 MG tablet Take 1 tablet (5 mg total) by mouth daily. 90 tablet 3   spironolactone (ALDACTONE) 25 MG tablet Take 1 tablet (25 mg total) by mouth every morning. 90 tablet 3   telmisartan (MICARDIS) 80 MG tablet Take 1 tablet (80 mg total) by mouth daily. 90 tablet 3   No facility-administered medications prior to visit.   No Known Allergies    Objective:   Today's Vitals   08/28/21 1602  BP: 134/82  Pulse: (!) 59  Temp: (!) 97.4 F (36.3 C)  SpO2: 95%  Weight: 222 lb 3.2  oz (100.8 kg)  Height: $Remove'5\' 1"'eTbGSRX$  (1.549 m)   Body mass index is 41.98 kg/m.   General: Well developed, well nourished. No acute distress. Psych: Alert and oriented. Normal mood and affect.  Health Maintenance Due  Topic Date Due   Hepatitis C Screening  Never done   COVID-19 Vaccine (4 - Booster for Pfizer series) 09/10/2020   INFLUENZA VACCINE  05/06/2021   Lab Results Lab Results  Component Value Date   HGBA1C 8.9 (H) 05/13/2021   BMP Latest Ref Rng & Units 05/13/2021 06/22/2019 12/10/2018  Glucose 70 - 99 mg/dL 139(H) 86 199(H)  BUN 6 - 23 mg/dL 25(H) 20 17  Creatinine 0.40 - 1.20 mg/dL 1.07 1.05(H) 0.90  Sodium 135 - 145 mEq/L 135 140 141  Potassium 3.5 - 5.1 mEq/L 4.0 3.9 3.6  Chloride 96 - 112 mEq/L 98 104 104   CO2 19 - 32 mEq/L $Remove'26 27 30  'GeqEVnY$ Calcium 8.4 - 10.5 mg/dL 10.1 10.5(H) 10.3   Lab Results  Component Value Date   CHOL 149 05/13/2021   HDL 53.50 05/13/2021   LDLCALC 81 05/13/2021   TRIG 71.0 05/13/2021   CHOLHDL 3 05/13/2021    Component Ref Range & Units 3 mo ago   Microalb, Ur 0.0 - 1.9 mg/dL <0.7   Creatinine,U mg/dL 62.2   Microalb Creat Ratio 0.0 - 30.0 mg/g 1.1       Assessment & Plan:   1. Type 2 diabetes mellitus without complication, without long-term current use of insulin ([place]) Ms. Scannell's A1c was elevated on her last blood test. We increased her metformin to 500 mg bid. I will reassess her A1c today.  - Glucose, random - Hemoglobin A1c  2. Essential hypertension Blood pressure is higher today, but she did not take her medication today. We will continue her current 4-drug therapy and reassess in 3 months.  3. History of DVT (deep vein thrombosis) Continue anticoagulation.  4. Hyperlipidemia, unspecified hyperlipidemia type Last lipids at goal on rosuvastatin.  5. Encounter for hepatitis C screening test for low risk patient  - HCV Ab w Reflex to Quant PCR  6. Screening for HIV (human immunodeficiency virus)  - HIV Antibody (routine testing w rflx)  7. Need for influenza vaccination  - Flu Vaccine QUAD 6+ mos PF IM (Fluarix Quad PF)  Haydee Salter, MD

## 2021-08-29 LAB — HEMOGLOBIN A1C
Est. average glucose Bld gHb Est-mCnc: 169 mg/dL
Hgb A1c MFr Bld: 7.5 % — ABNORMAL HIGH (ref 4.8–5.6)

## 2021-08-29 LAB — HCV INTERPRETATION

## 2021-08-29 LAB — HIV ANTIBODY (ROUTINE TESTING W REFLEX): HIV Screen 4th Generation wRfx: NONREACTIVE

## 2021-08-29 LAB — HCV AB W REFLEX TO QUANT PCR: HCV Ab: <0.1 s/co ratio (ref 0.0–0.9)

## 2021-08-29 LAB — GLUCOSE, RANDOM: Glucose: 98 mg/dL (ref 70–99)

## 2021-10-02 ENCOUNTER — Encounter: Payer: Self-pay | Admitting: Gastroenterology

## 2021-10-02 NOTE — Telephone Encounter (Signed)
Scheduled for 10/28/21

## 2021-10-08 DIAGNOSIS — M5416 Radiculopathy, lumbar region: Secondary | ICD-10-CM | POA: Diagnosis not present

## 2021-10-25 DIAGNOSIS — M5416 Radiculopathy, lumbar region: Secondary | ICD-10-CM | POA: Diagnosis not present

## 2021-10-28 ENCOUNTER — Ambulatory Visit: Payer: Medicare Other | Admitting: Gastroenterology

## 2021-11-08 DIAGNOSIS — M7062 Trochanteric bursitis, left hip: Secondary | ICD-10-CM | POA: Diagnosis not present

## 2021-11-27 DIAGNOSIS — M7061 Trochanteric bursitis, right hip: Secondary | ICD-10-CM | POA: Diagnosis not present

## 2021-11-28 ENCOUNTER — Encounter: Payer: Self-pay | Admitting: Gastroenterology

## 2021-11-28 ENCOUNTER — Ambulatory Visit: Payer: Medicare Other | Admitting: Gastroenterology

## 2021-11-28 VITALS — BP 116/80 | HR 65 | Resp 16 | Ht 61.0 in | Wt 214.4 lb

## 2021-11-28 DIAGNOSIS — Z9229 Personal history of other drug therapy: Secondary | ICD-10-CM

## 2021-11-28 DIAGNOSIS — R195 Other fecal abnormalities: Secondary | ICD-10-CM

## 2021-11-28 DIAGNOSIS — D689 Coagulation defect, unspecified: Secondary | ICD-10-CM

## 2021-11-28 MED ORDER — SUTAB 1479-225-188 MG PO TABS: 1.0000 | ORAL_TABLET | Freq: Once | ORAL | 0 refills | Status: AC

## 2021-11-28 NOTE — Patient Instructions (Signed)
If you are age 63 or older, your body mass index should be between 23-30. Your Body mass index is 40.51 kg/m. If this is out of the aforementioned range listed, please consider follow up with your Primary Care Provider.  If you are age 76 or younger, your body mass index should be between 19-25. Your Body mass index is 40.51 kg/m. If this is out of the aformentioned range listed, please consider follow up with your Primary Care Provider.   You have been scheduled for a colonoscopy. Please follow written instructions given to you at your visit today.  Please pick up your prep supplies at the pharmacy within the next 1-3 days. If you use inhalers (even only as needed), please bring them with you on the day of your procedure.  The [facility] providers would like to encourage you to use [facility] to communicate with providers for non-urgent requests or questions.  Due to long hold times on the telephone, sending your provider a message by [facility] may be a faster and more efficient way to get a response.  Please allow 48 business hours for a response.  Please remember that this is for non-urgent requests.   It was a pleasure to see you today!  Thank you for trusting me with your gastrointestinal care!    Scott E.Cunningham,MD

## 2021-11-28 NOTE — Progress Notes (Signed)
HPI : Laura Campbell is a pleasant 61 year old female with a history of recurrent DVT/PE and Protein S/C deficiency, obesity and diabetes who is referred to [place] by Dr. Arlester Marker for a positive Cologuard test.  She denies any symptoms of overt GI blood loss such as melena or hematochezia.  She denies any chronic GI symptoms to include abdominal pain, constipation or diarrhea.  No family history of colon cancer. She attempted a colonoscopy in 2015 but she was unable to complete the bowel prep (nausea/vomiting) and so the procedure was not attempted.   The patient has a history of DVT and PE x 2, most recently in 2018 and was found to have Protein S and C deficiency.  She has been on anticoagulation for many years.  She has held her Eliquis in the past for spinal injections.   She has no known cardiopulmonary comorbidities and denies any concerning symptoms such as chest pain/pressure, palpitations, light-headedness/dizziness, dyspnea/orthopnea.   Past Medical History:  Diagnosis Date   Diabetes mellitus without complication ([place])    Hypertension    Obesity      Past Surgical History:  Procedure Laterality Date   ABDOMINAL HYSTERECTOMY  10/06/2008   TONSILLECTOMY     Family History  Problem Relation Age of Onset   Heart disease Mother    Cirrhosis Father    Stroke Sister    Diabetes Sister    Cancer Sister        Breast   Breast cancer Maternal Aunt    Breast cancer Cousin    Neuropathy Neg Hx    Colon cancer Neg Hx    Esophageal cancer Neg Hx    Liver cancer Neg Hx    Pancreatic cancer Neg Hx    Social History   Tobacco Use   Smoking status: Former    Types: Cigarettes    Quit date: 06/06/1994    Years since quitting: 27.4   Smokeless tobacco: Never  Vaping Use   Vaping Use: Never used  Substance Use Topics   Alcohol use: No    Alcohol/week: 0.0 standard drinks   Drug use: No   Current Outpatient Medications  Medication Sig Dispense Refill   amLODipine (NORVASC)  10 MG tablet Take 1 tablet (10 mg total) by mouth daily. 90 tablet 3   apixaban (ELIQUIS) 2.5 MG TABS tablet Take 1 tablet (2.5 mg total) by mouth 2 (two) times daily. 60 tablet 11   Blood Glucose Monitoring Suppl (ONE TOUCH ULTRA MINI) W/DEVICE KIT      hydrochlorothiazide (HYDRODIURIL) 25 MG tablet Take 1 tablet (25 mg total) by mouth daily. 90 tablet 3   metFORMIN (GLUCOPHAGE) 500 MG tablet Take 1 tablet (500 mg total) by mouth 2 (two) times daily with a meal. 180 tablet 3   ONE TOUCH ULTRA TEST test strip      rosuvastatin (CRESTOR) 5 MG tablet Take 1 tablet (5 mg total) by mouth daily. 90 tablet 3   spironolactone (ALDACTONE) 25 MG tablet Take 1 tablet (25 mg total) by mouth every morning. 90 tablet 3   telmisartan (MICARDIS) 80 MG tablet Take 1 tablet (80 mg total) by mouth daily. 90 tablet 3   No current facility-administered medications for this visit.   No Known Allergies   Review of Systems: All systems reviewed and negative except where noted in HPI.    No results found.  Physical Exam: BP 116/80 (BP Location: Left Arm, Patient Position: Sitting, Cuff Size: Normal)  Pulse 65    Resp 16    Ht 5' 1" (1.549 m)    Wt 214 lb 6.4 oz (97.3 kg)    SpO2 97%    BMI 40.51 kg/m  Constitutional: Pleasant,well-developed, obese African American female in no acute distress. HEENT: Normocephalic and atraumatic. Conjunctivae are normal. No scleral icterus. Cardiovascular: Normal rate, regular rhythm.  Pulmonary/chest: Effort normal and breath sounds normal. No wheezing, rales or rhonchi. Abdominal: Soft, nondistended, nontender. Bowel sounds active throughout. There are no masses palpable. No hepatomegaly. Extremities: no edema Neurological: Alert and oriented to person place and time. Skin: Skin is warm and dry. No rashes noted. Psychiatric: Normal mood and affect. Behavior is normal.  CBC    Component Value Date/Time   WBC 7.4 06/22/2019 1311   WBC 11.1 (H) 05/08/2013 1949   RBC  5.53 (H) 06/22/2019 1311   HGB 13.7 06/22/2019 1311   HCT 45.0 06/22/2019 1311   PLT 258 06/22/2019 1311   MCV 81.4 06/22/2019 1311   MCH 24.8 (L) 06/22/2019 1311   MCHC 30.4 06/22/2019 1311   RDW 15.5 06/22/2019 1311   LYMPHSABS 2.7 06/22/2019 1311   MONOABS 0.5 06/22/2019 1311   EOSABS 0.4 06/22/2019 1311   BASOSABS 0.1 06/22/2019 1311    CMP     Component Value Date/Time   NA 135 05/13/2021 1010   K 4.0 05/13/2021 1010   CL 98 05/13/2021 1010   CO2 26 05/13/2021 1010   GLUCOSE 98 08/28/2021 1626   GLUCOSE 139 (H) 05/13/2021 1010   BUN 25 (H) 05/13/2021 1010   CREATININE 1.07 05/13/2021 1010   CREATININE 1.05 (H) 06/22/2019 1311   CALCIUM 10.1 05/13/2021 1010   PROT 7.8 06/22/2019 1311   ALBUMIN 4.2 06/22/2019 1311   AST 15 06/22/2019 1311   ALT 11 06/22/2019 1311   ALKPHOS 81 06/22/2019 1311   BILITOT 0.4 06/22/2019 1311   GFRNONAA 58 (L) 06/22/2019 1311   GFRAA >60 06/22/2019 1311     ASSESSMENT AND PLAN: 61 year old female with a history of recurrent DVT/PE and Protein S/C deficiency, obesity and diabetes who is referred for a positive Cologuard test.  She denies any symptoms of overt GI blood loss such as melena or hematochezia.  She denies any chronic GI symptoms to include abdominal pain, constipation or diarrhea.  No family history of colon cancer. Will schedule patient for colonoscopy.  Given her intolerance of previous bowel prep, I recommended she try Sutab instead a liquid-based prep.  Plan to hold Eliquis for 2 days prior to her colonoscopy.  Will send message to Dr. Antonieta Pert office to ensure that this is a reasonable risk.  Positive Cologuard - Colonoscopy - Hold Eliquis x 2 days, request approval from patient's hematologist  The details, risks (including bleeding, perforation, infection, missed lesions, medication reactions and possible hospitalization or surgery if complications occur), benefits, and alternatives to colonoscopy with possible biopsy and  possible polypectomy were discussed with the patient and she consents to proceed.   Jay Kempe E. Candis Schatz, MD [facility]  CC:  Haydee Salter, MD

## 2021-11-29 ENCOUNTER — Ambulatory Visit: Payer: Medicare Other | Admitting: Family Medicine

## 2021-11-30 ENCOUNTER — Encounter: Payer: Self-pay | Admitting: Gastroenterology

## 2021-12-02 ENCOUNTER — Encounter: Payer: Self-pay | Admitting: Gastroenterology

## 2021-12-06 ENCOUNTER — Encounter: Payer: Self-pay | Admitting: Gastroenterology

## 2021-12-06 ENCOUNTER — Other Ambulatory Visit: Payer: Self-pay

## 2021-12-06 ENCOUNTER — Ambulatory Visit (AMBULATORY_SURGERY_CENTER): Payer: Medicare Other | Admitting: Gastroenterology

## 2021-12-06 VITALS — BP 114/52 | HR 54 | Temp 96.0°F | Resp 17 | Ht 61.0 in | Wt 214.0 lb

## 2021-12-06 DIAGNOSIS — K5730 Diverticulosis of large intestine without perforation or abscess without bleeding: Secondary | ICD-10-CM | POA: Diagnosis not present

## 2021-12-06 DIAGNOSIS — K5140 Inflammatory polyps of colon without complications: Secondary | ICD-10-CM | POA: Diagnosis not present

## 2021-12-06 DIAGNOSIS — Z1211 Encounter for screening for malignant neoplasm of colon: Secondary | ICD-10-CM | POA: Diagnosis not present

## 2021-12-06 DIAGNOSIS — R195 Other fecal abnormalities: Secondary | ICD-10-CM | POA: Diagnosis not present

## 2021-12-06 DIAGNOSIS — D125 Benign neoplasm of sigmoid colon: Secondary | ICD-10-CM | POA: Diagnosis not present

## 2021-12-06 MED ORDER — SODIUM CHLORIDE 0.9 % IV SOLN: 500.0000 mL | INTRAVENOUS | Status: DC

## 2021-12-06 NOTE — Progress Notes (Signed)
Pt's states no medical or surgical changes since previsit or office visit. 

## 2021-12-06 NOTE — Op Note (Signed)
[name] ?Patient Name: Laura Campbell ?Procedure Date: 12/06/2021 8:47 AM ?MRN: [id number] ?Endoscopist: Hobert Poplaski E. Candis Schatz , MD ?Age: 61 ?Referring MD:  ?Date of Birth: 19-Oct-1960 ?Gender: Female ?Account #: [id number] ?Procedure:                Colonoscopy ?Indications:              Positive Cologuard test ?Medicines:                Monitored Anesthesia Care ?Procedure:                Pre-Anesthesia Assessment: ?                          - Prior to the procedure, a History and Physical  ?                          was performed, and patient medications and  ?                          allergies were reviewed. The patient's tolerance of  ?                          previous anesthesia was also reviewed. The risks  ?                          and benefits of the procedure and the sedation  ?                          options and risks were discussed with the patient.  ?                          All questions were answered, and informed consent  ?                          was obtained. Prior Anticoagulants: The patient has  ?                          taken Eliquis (apixaban), last dose was 2 days  ?                          prior to procedure. ASA Grade Assessment: III - A  ?                          patient with severe systemic disease. After  ?                          reviewing the risks and benefits, the patient was  ?                          deemed in satisfactory condition to undergo the  ?                          procedure. ?  After obtaining informed consent, the colonoscope  ?                          was passed under direct vision. Throughout the  ?                          procedure, the patient's blood pressure, pulse, and  ?                          oxygen saturations were monitored continuously. The  ?                          Olympus CF-[id number] (#[id number]) Colonoscope was  ?                          introduced through the anus and advanced to the the  ?                           cecum, identified by appendiceal orifice and  ?                          ileocecal valve. The colonoscopy was unusually  ?                          difficult due to multiple diverticula in the colon,  ?                          restricted mobility of the colon, significant  ?                          looping and a tortuous colon. There was fixed,  ?                          narrowed angulation in the sigmoid colon that was  ?                          not traversable with the adult colonoscope. Once in  ?                          the right colon there was significant looping which  ?                          required manual pressure. Successful completion of  ?                          the procedure was aided by using manual pressure  ?                          and withdrawing the scope and replacing with the  ?                          ultraslim colonoscope. The quality of the bowel  ?  preparation was good. The ileocecal valve,  ?                          appendiceal orifice, and rectum were photographed.  ?                          The bowel preparation used was Sutab via split dose  ?                          instruction. The [id number]-[id number] Slim SB  ?                          Colonoscope was introduced through the anus and  ?                          advanced to the the cecum, identified by  ?                          appendiceal orifice and ileocecal valve. ?Scope In: 8:55:13 AM ?Scope Out: 9:35:28 AM ?Scope Withdrawal Time: 0 hours 20 minutes 11 seconds  ?Total Procedure Duration: 0 hours 40 minutes 15 seconds  ?Findings:                 The perianal and digital rectal examinations were  ?                          normal. Pertinent negatives include normal  ?                          sphincter tone and no palpable rectal lesions. ?                          Three flat and sessile polyps were found in the  ?                          sigmoid colon. The polyps were 3 to 7 mm in size.   ?                          These polyps were removed with a cold snare.  ?                          Resection and retrieval were complete. Estimated  ?                          blood loss was minimal. ?                          Multiple small and large-mouthed diverticula were  ?                          found in the sigmoid colon and descending colon. ?                          The exam was otherwise normal throughout the  ?  examined colon. ?                          The retroflexed view of the distal rectum and anal  ?                          verge was normal and showed no anal or rectal  ?                          abnormalities. ?Complications:            No immediate complications. ?Estimated Blood Loss:     Estimated blood loss was minimal. ?Impression:               - Three 3 to 7 mm polyps in the sigmoid colon,  ?                          removed with a cold snare. Resected and retrieved. ?                          - Diverticulosis in the sigmoid colon and in the  ?                          descending colon. ?                          - The distal rectum and anal verge are normal on  ?                          retroflexion view. ?Recommendation:           - Patient has a contact number available for  ?                          emergencies. The signs and symptoms of potential  ?                          delayed complications were discussed with the  ?                          patient. Return to normal activities tomorrow.  ?                          Written discharge instructions were provided to the  ?                          patient. ?                          - Resume previous diet. ?                          - Continue present medications. ?                          - Await pathology results. ?                          -  Resume Eliquis (apixaban) at prior dose tomorrow. ?                          - Repeat colonoscopy (date not yet determined) for  ?                           surveillance based on pathology results. ?Briante Loveall E. Candis Schatz, MD ?12/06/2021 9:44:42 AM ?This report has been signed electronically. ?

## 2021-12-06 NOTE — Progress Notes (Signed)
History and Physical Interval Note: ? ?12/06/2021 ?8:48 AM ? ?Laura Campbell  has presented today for endoscopic procedure(s), with the diagnosis of  ?Encounter Diagnosis  ?Name Primary?  ? Positive colorectal cancer screening using Cologuard test Yes  ?Marland Kitchen  The various methods of evaluation and treatment have been discussed with the patient and/or family. After consideration of risks, benefits and other options for treatment, the patient has consented to  the endoscopic procedure(s). ? ? The patient's history has been reviewed, patient examined, no change in status, stable for endoscopic procedure(s).  I have reviewed the patient's chart and labs.  Questions were answered to the patient's satisfaction.   ? ? ?Jaymeson Mengel E. Candis Schatz, MD ?[facility] ? ?

## 2021-12-06 NOTE — Patient Instructions (Signed)
Resume your Eliquis tomorrow at the prior dose.  Read all of your discharge instructions. ? ?YOU HAD AN ENDOSCOPIC PROCEDURE TODAY AT [facility]:   Refer to the procedure report that was given to you for any specific questions about what was found during the examination.  If the procedure report does not answer your questions, please call your gastroenterologist to clarify.  If you requested that your care partner not be given the details of your procedure findings, then the procedure report has been included in a sealed envelope for you to review at your convenience later. ? ?YOU SHOULD EXPECT: Some feelings of bloating in the abdomen. Passage of more gas than usual.  Walking can help get rid of the air that was put into your GI tract during the procedure and reduce the bloating. If you had a lower endoscopy (such as a colonoscopy or flexible sigmoidoscopy) you may notice spotting of blood in your stool or on the toilet paper. If you underwent a bowel prep for your procedure, you may not have a normal bowel movement for a few days. ? ?Please Note:  You might notice some irritation and congestion in your nose or some drainage.  This is from the oxygen used during your procedure.  There is no need for concern and it should clear up in a day or so. ? ?SYMPTOMS TO REPORT IMMEDIATELY: ? ?Following lower endoscopy (colonoscopy or flexible sigmoidoscopy): ? Excessive amounts of blood in the stool ? Significant tenderness or worsening of abdominal pains ? Swelling of the abdomen that is new, acute ? Fever of 100?F or higher ? ?For urgent or emergent issues, a gastroenterologist can be reached at any hour by calling [phone]. ?Do not use MyChart messaging for urgent concerns.  ? ? ?DIET:  We do recommend a small meal at first, but then you may proceed to your regular diet.  Drink plenty of fluids but you should avoid alcoholic beverages for 24 hours. ? ?ACTIVITY:  You should plan to take it easy for  the rest of today and you should NOT DRIVE or use heavy machinery until tomorrow (because of the sedation medicines used during the test).   ? ?FOLLOW UP: ?Our staff will call the number listed on your records 48-72 hours following your procedure to check on you and address any questions or concerns that you may have regarding the information given to you following your procedure. If we do not reach you, we will leave a message.  We will attempt to reach you two times.  During this call, we will ask if you have developed any symptoms of COVID 19. If you develop any symptoms (ie: fever, flu-like symptoms, shortness of breath, cough etc.) before then, please call [phone].  If you test positive for Covid 19 in the 2 weeks post procedure, please call and report this information to [place].   ? ?If any biopsies were taken you will be contacted by phone or by letter within the next 1-3 weeks.  Please call us at [phone] if you have not heard about the biopsies in 3 weeks.  ? ? ?SIGNATURES/CONFIDENTIALITY: ?You and/or your care partner have signed paperwork which will be entered into your electronic medical record.  These signatures attest to the fact that that the information above on your After Visit Summary has been reviewed and is understood.  Full responsibility of the confidentiality of this discharge information lies with you and/or your care-partner.  ?

## 2021-12-06 NOTE — Progress Notes (Signed)
Called to room to assist during endoscopic procedure.  Patient ID and intended procedure confirmed with present staff. Received instructions for my participation in the procedure from the performing physician.  

## 2021-12-06 NOTE — Progress Notes (Signed)
Report to PACU, RN, vss, BBS= Clear.  

## 2021-12-10 ENCOUNTER — Telehealth: Payer: Self-pay

## 2021-12-10 NOTE — Telephone Encounter (Signed)
First post procedure follow up call, no answer 

## 2021-12-10 NOTE — Telephone Encounter (Signed)
?  Follow up Call- ? ?Call back number 12/06/2021  ?Post procedure Call Back phone  # [phone]  ?Permission to leave phone message Yes  ?Some recent data might be hidden  ?  ? ?Patient questions: ? ?Do you have a fever, pain , or abdominal swelling? No. ?Pain Score  0 * ? ?Have you tolerated food without any problems? Yes.   ? ?Have you been able to return to your normal activities? Yes.   ? ?Do you have any questions about your discharge instructions: ?Diet   No. ?Medications  No. ?Follow up visit  No. ? ?Do you have questions or concerns about your Care? No. ? ?Actions: ?* If pain score is 4 or above: ?No action needed, pain <4. ? ? ?

## 2021-12-11 ENCOUNTER — Ambulatory Visit (INDEPENDENT_AMBULATORY_CARE_PROVIDER_SITE_OTHER): Payer: Medicare Other | Admitting: Family Medicine

## 2021-12-11 ENCOUNTER — Encounter: Payer: Self-pay | Admitting: Family Medicine

## 2021-12-11 ENCOUNTER — Other Ambulatory Visit: Payer: Self-pay

## 2021-12-11 ENCOUNTER — Encounter: Payer: Self-pay | Admitting: Gastroenterology

## 2021-12-11 VITALS — BP 118/70 | HR 62 | Temp 97.6°F | Ht 61.0 in | Wt 213.4 lb

## 2021-12-11 DIAGNOSIS — G8929 Other chronic pain: Secondary | ICD-10-CM

## 2021-12-11 DIAGNOSIS — E119 Type 2 diabetes mellitus without complications: Secondary | ICD-10-CM

## 2021-12-11 DIAGNOSIS — Z86718 Personal history of other venous thrombosis and embolism: Secondary | ICD-10-CM

## 2021-12-11 DIAGNOSIS — E785 Hyperlipidemia, unspecified: Secondary | ICD-10-CM | POA: Diagnosis not present

## 2021-12-11 DIAGNOSIS — M545 Low back pain, unspecified: Secondary | ICD-10-CM

## 2021-12-11 DIAGNOSIS — K5790 Diverticulosis of intestine, part unspecified, without perforation or abscess without bleeding: Secondary | ICD-10-CM | POA: Insufficient documentation

## 2021-12-11 DIAGNOSIS — I10 Essential (primary) hypertension: Secondary | ICD-10-CM

## 2021-12-11 DIAGNOSIS — Z86010 Personal history of colonic polyps: Secondary | ICD-10-CM | POA: Insufficient documentation

## 2021-12-11 NOTE — Progress Notes (Signed)
?[facility] ?[facility] ?[place] ?[place] [address] ?Dept: [phone] ?Dept Fax: [phone] ? ?Chronic Care Office Visit ? ?Subjective:  ? ? Patient ID: Laura Campbell, female    DOB: December 14, 1960, 61 y.o..   MRN: [id number] ? ?Chief Complaint  ?Patient presents with  ? Diabetes  ? Hyperlipidemia  ? Hypertension  ? ? ?History of Present Illness: ? ?Patient is in today for reassessment of chronic medical issues. ? ?Laura Campbell has a history of recurrent DVT and a prior PE. She is currently on Eliquis and is to remain on this lifelong. ?  ?Laura Campbell has a history of Type 2 diabetes. She is managed on metformin. She is up to date on her health screenings. ?  ?Laura Campbell has a history of hypertension that had been difficult to control. She is currently on amlodipine, HCTZ, spironolactone, and telmisartan. She denies any problems with her meds. ?  ?Laura Campbell has a history of hyperlipidemia and is managed on rosuvastatin. ? ?Laura Campbell had a positive Cologuard test. She recently had a colonoscopy. She had a few benign polyps found and diverticulosis, but no evidence of malignancy. ? ?Past Medical History: ?Patient Active Problem List  ? Diagnosis Date Noted  ? Abnormal stool test- Positive Cologuard 06/03/2021  ? Lumbar radiculopathy, chronic 05/07/2021  ? Spinal stenosis, lumbar region without neurogenic claudication 05/07/2021  ? Essential hypertension 05/07/2021  ? History of DVT (deep vein thrombosis) 05/07/2021  ? History of pulmonary embolus (PE) 05/07/2021  ? Hyperlipidemia 05/07/2021  ? Chronic low back pain 05/07/2021  ? Hereditary and idiopathic peripheral neuropathy 03/27/2015  ? Left foot pain 03/27/2015  ? Type 2 diabetes mellitus ([place]) 03/27/2015  ? ?Past Surgical History:  ?Procedure Laterality Date  ? ABDOMINAL HYSTERECTOMY  10/06/2008  ? TONSILLECTOMY    ? ?Family History  ?Problem Relation Age of Onset  ? Heart disease Mother   ? Cirrhosis  Father   ? Stroke Sister   ? Diabetes Sister   ? Cancer Sister   ?     Breast  ? Breast cancer Maternal Aunt   ? Breast cancer Cousin   ? Neuropathy Neg Hx   ? Colon cancer Neg Hx   ? Esophageal cancer Neg Hx   ? Liver cancer Neg Hx   ? Pancreatic cancer Neg Hx   ? ?Outpatient Medications Prior to Visit  ?Medication Sig Dispense Refill  ? amLODipine (NORVASC) 10 MG tablet Take 1 tablet (10 mg total) by mouth daily. 90 tablet 3  ? apixaban (ELIQUIS) 2.5 MG TABS tablet Take 1 tablet (2.5 mg total) by mouth 2 (two) times daily. 60 tablet 11  ? Blood Glucose Monitoring Suppl (ONE TOUCH ULTRA MINI) W/DEVICE KIT     ? hydrochlorothiazide (HYDRODIURIL) 25 MG tablet Take 1 tablet (25 mg total) by mouth daily. 90 tablet 3  ? metFORMIN (GLUCOPHAGE) 500 MG tablet Take 1 tablet (500 mg total) by mouth 2 (two) times daily with a meal. 180 tablet 3  ? ONE TOUCH ULTRA TEST test strip     ? rosuvastatin (CRESTOR) 5 MG tablet Take 1 tablet (5 mg total) by mouth daily. 90 tablet 3  ? spironolactone (ALDACTONE) 25 MG tablet Take 1 tablet (25 mg total) by mouth every morning. 90 tablet 3  ? telmisartan (MICARDIS) 80 MG tablet Take 1 tablet (80 mg total) by mouth daily. 90 tablet 3  ? traMADol (ULTRAM) 50 MG tablet Take 50 mg by mouth 3 (  three) times daily as needed. (Patient not taking: Reported on 12/11/2021)    ? ?No facility-administered medications prior to visit.  ? ?No Known Allergies ?   ?Objective:  ? ?Today's Vitals  ? 12/11/21 1531  ?BP: 118/70  ?Pulse: 62  ?Temp: 97.6 ?F (36.4 ?C)  ?TempSrc: Temporal  ?SpO2: 97%  ?Weight: 213 lb 6.4 oz (96.8 kg)  ?Height: _0  (1.549 m)  ? ?Body mass index is 40.32 kg/m?.  ? ?General: Well developed, well nourished. No acute distress. ?Psych: Alert and oriented. Normal mood and affect. ? ?There are no preventive care reminders to display for this patient. ?   ?Assessment & Plan:  ? ?1. Type 2 diabetes mellitus without complication, without long-term current use of insulin ([place]) ?I will check  quarterly labs. Laura Campbell's A1c had been improving, so hope to now be under 7. She is up to date with other screenings. Continue metformin 500 mg bid. ? ?- Glucose, random; Future ?- Hemoglobin A1c; Future ? ?2. Essential hypertension ?Blood pressure is at goal. Continue amlodipine 10 mg, spironolactone 25 mg, and telmisartan 80 mg. ? ?3. Hyperlipidemia, unspecified hyperlipidemia type ?Due for follow-up lipids. Continue rosuvastatin 5 mg. ? ?- Lipid panel; Future ? ?4. History of DVT (deep vein thrombosis) ?Continue indefinite Eliquis use. ? ?5. Chronic bilateral low back pain without sciatica ?Stable. had recent ESI. Continue to follow with Dr. Ron Agee. ? ?Return in about 3 months (around 03/13/2022) for Reassessment.  ? ?Haydee Salter, MD ?

## 2021-12-12 ENCOUNTER — Ambulatory Visit: Payer: Medicare Other | Admitting: Family Medicine

## 2021-12-18 ENCOUNTER — Other Ambulatory Visit: Payer: Medicare Other

## 2021-12-20 ENCOUNTER — Other Ambulatory Visit: Payer: Self-pay

## 2021-12-20 ENCOUNTER — Other Ambulatory Visit (INDEPENDENT_AMBULATORY_CARE_PROVIDER_SITE_OTHER): Payer: Medicare Other

## 2021-12-20 DIAGNOSIS — E119 Type 2 diabetes mellitus without complications: Secondary | ICD-10-CM

## 2021-12-20 DIAGNOSIS — E785 Hyperlipidemia, unspecified: Secondary | ICD-10-CM | POA: Diagnosis not present

## 2021-12-20 NOTE — Progress Notes (Signed)
Per the orders of Dr. Gena Fray pt is here for fasting labs pt tolerated draw well.  ?

## 2021-12-20 NOTE — Addendum Note (Signed)
Addended by: Beryle Lathe S on: 12/20/2021 02:52 PM ? ? Modules accepted: Orders ? ?

## 2021-12-21 LAB — LIPID PANEL
Cholesterol: 176 mg/dL (ref <200)
HDL: 67 mg/dL (ref >=50)
LDL Cholesterol (Calc): 91 mg/dL (calc)
Non-HDL Cholesterol (Calc): 109 mg/dL (calc) (ref <130)
Total CHOL/HDL Ratio: 2.6 (calc) (ref <5.0)
Triglycerides: 85 mg/dL (ref <150)

## 2021-12-21 LAB — HEMOGLOBIN A1C
Hgb A1c MFr Bld: 7.9 % of total Hgb — ABNORMAL HIGH (ref <5.7)
Mean Plasma Glucose: 180 mg/dL
eAG (mmol/L): 10 mmol/L

## 2021-12-21 LAB — GLUCOSE, RANDOM: Glucose, Plasma: 110 mg/dL (ref 65–139)

## 2022-01-09 ENCOUNTER — Ambulatory Visit: Payer: Medicare Other | Admitting: Family Medicine

## 2022-01-13 ENCOUNTER — Ambulatory Visit: Payer: Medicare Other | Admitting: Family Medicine

## 2022-03-07 DIAGNOSIS — M7062 Trochanteric bursitis, left hip: Secondary | ICD-10-CM | POA: Diagnosis not present

## 2022-03-18 ENCOUNTER — Ambulatory Visit: Payer: Medicare Other | Admitting: Family Medicine

## 2022-03-18 DIAGNOSIS — M7062 Trochanteric bursitis, left hip: Secondary | ICD-10-CM | POA: Diagnosis not present

## 2022-03-18 DIAGNOSIS — M7061 Trochanteric bursitis, right hip: Secondary | ICD-10-CM | POA: Diagnosis not present

## 2022-03-19 DIAGNOSIS — M7061 Trochanteric bursitis, right hip: Secondary | ICD-10-CM | POA: Diagnosis not present

## 2022-03-31 ENCOUNTER — Telehealth: Payer: Self-pay | Admitting: Family Medicine

## 2022-03-31 NOTE — Telephone Encounter (Signed)
Patient due for f/u appointment, scheduled her for 04/15/22@ 10:20 am. Dm/cma

## 2022-04-02 DIAGNOSIS — M545 Low back pain, unspecified: Secondary | ICD-10-CM | POA: Diagnosis not present

## 2022-04-15 ENCOUNTER — Ambulatory Visit (INDEPENDENT_AMBULATORY_CARE_PROVIDER_SITE_OTHER): Payer: Medicare Other | Admitting: Family Medicine

## 2022-04-15 ENCOUNTER — Encounter: Payer: Self-pay | Admitting: Family Medicine

## 2022-04-15 VITALS — BP 116/74 | HR 68 | Temp 97.3°F | Ht 61.0 in | Wt 211.8 lb

## 2022-04-15 DIAGNOSIS — R252 Cramp and spasm: Secondary | ICD-10-CM

## 2022-04-15 DIAGNOSIS — I10 Essential (primary) hypertension: Secondary | ICD-10-CM

## 2022-04-15 DIAGNOSIS — E119 Type 2 diabetes mellitus without complications: Secondary | ICD-10-CM

## 2022-04-15 DIAGNOSIS — E785 Hyperlipidemia, unspecified: Secondary | ICD-10-CM | POA: Diagnosis not present

## 2022-04-15 DIAGNOSIS — D6859 Other primary thrombophilia: Secondary | ICD-10-CM | POA: Insufficient documentation

## 2022-04-15 DIAGNOSIS — Z86718 Personal history of other venous thrombosis and embolism: Secondary | ICD-10-CM

## 2022-04-15 DIAGNOSIS — Z86711 Personal history of pulmonary embolism: Secondary | ICD-10-CM

## 2022-04-15 LAB — GLUCOSE, RANDOM: Glucose, Bld: 130 mg/dL — ABNORMAL HIGH (ref 70–99)

## 2022-04-15 LAB — MAGNESIUM: Magnesium: 2.1 mg/dL (ref 1.5–2.5)

## 2022-04-15 LAB — HEMOGLOBIN A1C: Hgb A1c MFr Bld: 8.2 % — ABNORMAL HIGH (ref 4.6–6.5)

## 2022-04-15 MED ORDER — METFORMIN HCL 500 MG PO TABS: 500.0000 mg | ORAL_TABLET | Freq: Two times a day (BID) | ORAL | 3 refills | Status: DC

## 2022-04-15 MED ORDER — AMLODIPINE BESYLATE 10 MG PO TABS: 10.0000 mg | ORAL_TABLET | Freq: Every day | ORAL | 3 refills | Status: DC

## 2022-04-15 MED ORDER — TELMISARTAN 80 MG PO TABS: 80.0000 mg | ORAL_TABLET | Freq: Every day | ORAL | 3 refills | Status: DC

## 2022-04-15 MED ORDER — HYDROCHLOROTHIAZIDE 25 MG PO TABS: 25.0000 mg | ORAL_TABLET | Freq: Every day | ORAL | 3 refills | Status: DC

## 2022-04-15 MED ORDER — SPIRONOLACTONE 25 MG PO TABS: 25.0000 mg | ORAL_TABLET | Freq: Every morning | ORAL | 3 refills | Status: DC

## 2022-04-15 MED ORDER — ROSUVASTATIN CALCIUM 5 MG PO TABS: 5.0000 mg | ORAL_TABLET | Freq: Every day | ORAL | 3 refills | Status: DC

## 2022-04-15 MED ORDER — APIXABAN 2.5 MG PO TABS: 2.5000 mg | ORAL_TABLET | Freq: Two times a day (BID) | ORAL | 11 refills | Status: DC

## 2022-04-15 NOTE — Progress Notes (Signed)
[facility] [address] [place] [address] Dept: [phone] Dept Fax: [phone]  Chronic Care Office Visit  Subjective:    Patient ID: Laura Campbell, female    DOB: 01-02-1961, 61 y.o..   MRN: [id number]  Chief Complaint  Patient presents with   Follow-up    3 month f/u DM/HTN/chol   not fasting today.      History of Present Illness:  Patient is in today for reassessment of chronic medical issues.  Ms. Mervin has a history of recurrent DVT and a prior PE. She is currently on Eliquis 2.5 mg bid and is to remain on this lifelong.   Ms. Kendrick has a history of Type 2 diabetes. She is managed on metformin 500 mg bid. Her last A1c had gone back up a small amount, but Ms. Delaine did not want to increase her medication. She continues to gradually work at weight loss and she feels her diabetes will be doing better.   Ms. Sciara has a history of hypertension that had been difficult to control. She is currently on amlodipine 10 mg daily, HCTZ 25 mg daily, spironolactone 25 mg daily, and telmisartan 80 mg daily.    Ms. Kemler has a history of hyperlipidemia and is managed on Crestor 5 mg daily.  Ms. Altemus notes that she has some chronic leg cramps, esp. in the left shin. This had been doing worse. She did some research on the Internet. She decided her magnesium might be low. She started taking a magnesium supplement and finds her leg cramps are back to their baseline.  Past Medical History: Patient Active Problem List   Diagnosis Date Noted   Morbid obesity with BMI of 40.0-44.9, adult ([place]) 04/15/2022   History of colon polyps 12/11/2021   Diverticulosis 12/11/2021   Abnormal stool test- Positive Cologuard 06/03/2021   Lumbar radiculopathy, chronic 05/07/2021   Spinal stenosis, lumbar region without neurogenic claudication 05/07/2021   Essential hypertension 05/07/2021   History of DVT (deep vein thrombosis)  05/07/2021   History of pulmonary embolus (PE) 05/07/2021   Hyperlipidemia 05/07/2021   Chronic low back pain 05/07/2021   Hereditary and idiopathic peripheral neuropathy 03/27/2015   Left foot pain 03/27/2015   Type 2 diabetes mellitus ([place]) 03/27/2015   Past Surgical History:  Procedure Laterality Date   ABDOMINAL HYSTERECTOMY  10/06/2008   TONSILLECTOMY     Family History  Problem Relation Age of Onset   Heart disease Mother    Cirrhosis Father    Stroke Sister    Diabetes Sister    Cancer Sister        Breast   Breast cancer Maternal Aunt    Breast cancer Cousin    Neuropathy Neg Hx    Colon cancer Neg Hx    Esophageal cancer Neg Hx    Liver cancer Neg Hx    Pancreatic cancer Neg Hx    Outpatient Medications Prior to Visit  Medication Sig Dispense Refill   amLODipine (NORVASC) 10 MG tablet Take 1 tablet (10 mg total) by mouth daily. 90 tablet 3   apixaban (ELIQUIS) 2.5 MG TABS tablet Take 1 tablet (2.5 mg total) by mouth 2 (two) times daily. 60 tablet 11   Blood Glucose Monitoring Suppl (ONE TOUCH ULTRA MINI) W/DEVICE KIT      hydrochlorothiazide (HYDRODIURIL) 25 MG tablet Take 1 tablet (25 mg total) by mouth daily. 90 tablet 3   metFORMIN (GLUCOPHAGE) 500 MG tablet Take 1 tablet (  500 mg total) by mouth 2 (two) times daily with a meal. 180 tablet 3   ONE TOUCH ULTRA TEST test strip      rosuvastatin (CRESTOR) 5 MG tablet Take 1 tablet (5 mg total) by mouth daily. 90 tablet 3   spironolactone (ALDACTONE) 25 MG tablet Take 1 tablet (25 mg total) by mouth every morning. 90 tablet 3   telmisartan (MICARDIS) 80 MG tablet Take 1 tablet (80 mg total) by mouth daily. 90 tablet 3   No facility-administered medications prior to visit.   No Known Allergies    Objective:   Today's Vitals   04/15/22 1021  BP: 116/74  Pulse: 68  Temp: (!) 97.3 F (36.3 C)  TempSrc: Temporal  SpO2: 97%  Weight: 211 lb 12.8 oz (96.1 kg)  Height: $Remove'5\' 1"'SfbZNkt$  (1.549 m)   Body mass index is 40.02  kg/m.   General: Well developed, well nourished. No acute distress. Psych: Alert and oriented. Normal mood and affect.  There are no preventive care reminders to display for this patient.    Assessment & Plan:   1. Type 2 diabetes mellitus without complication, without long-term current use of insulin (HCC) We will recheck Ms. Shimamoto's A1c today. She has had improvement over the past year and is having ongoign gradual weight loss. We will continue metformin.  - Glucose, random - Hemoglobin A1c - metFORMIN (GLUCOPHAGE) 500 MG tablet; Take 1 tablet (500 mg total) by mouth 2 (two) times daily with a meal.  Dispense: 180 tablet; Refill: 3  2. Essential hypertension BP is at goal. We will continue her 4-drug therapy.  - amLODipine (NORVASC) 10 MG tablet; Take 1 tablet (10 mg total) by mouth daily.  Dispense: 90 tablet; Refill: 3 - hydrochlorothiazide (HYDRODIURIL) 25 MG tablet; Take 1 tablet (25 mg total) by mouth daily.  Dispense: 90 tablet; Refill: 3 - spironolactone (ALDACTONE) 25 MG tablet; Take 1 tablet (25 mg total) by mouth every morning.  Dispense: 90 tablet; Refill: 3 - telmisartan (MICARDIS) 80 MG tablet; Take 1 tablet (80 mg total) by mouth daily.  Dispense: 90 tablet; Refill: 3  3. Hyperlipidemia, unspecified hyperlipidemia type Continue rosuvastatin.  - rosuvastatin (CRESTOR) 5 MG tablet; Take 1 tablet (5 mg total) by mouth daily.  Dispense: 90 tablet; Refill: 3  4. Leg cramps I will check her magnesium level to see if this is now normal. Hypomagnesemia could have been the underlying cause of her leg cramps.  - Magnesium  5. History of deep vein thrombosis (DVT) of lower extremity 6. Protein C deficiency ([place]) 7. Protein S deficiency ([place]) 8. History of pulmonary embolus (PE) Continue lifelong anticoagulation.  - apixaban (ELIQUIS) 2.5 MG TABS tablet; Take 1 tablet (2.5 mg total) by mouth 2 (two) times daily.  Dispense: 60 tablet; Refill: 11   Return in about 3  months (around 07/16/2022) for Reassessment.   Haydee Salter, MD

## 2022-04-16 MED ORDER — METFORMIN HCL 1000 MG PO TABS: 1000.0000 mg | ORAL_TABLET | Freq: Two times a day (BID) | ORAL | 3 refills | Status: DC

## 2022-04-16 NOTE — Addendum Note (Signed)
Addended by: Haydee Salter on: 04/16/2022 03:02 PM   Modules accepted: Orders

## 2022-04-18 DIAGNOSIS — M7062 Trochanteric bursitis, left hip: Secondary | ICD-10-CM | POA: Diagnosis not present

## 2022-04-18 DIAGNOSIS — M545 Low back pain, unspecified: Secondary | ICD-10-CM | POA: Diagnosis not present

## 2022-06-24 ENCOUNTER — Telehealth: Payer: Self-pay

## 2022-06-24 NOTE — Telephone Encounter (Signed)
Called patient x 3 , with no answer and unable to leave a voice mail.  Patient may reschedule for the next available appointment.  L.Wilson,LPN

## 2022-07-01 NOTE — Telephone Encounter (Signed)
I spoke to patient and she  scheduled AWV on 07/22/2022

## 2022-07-16 ENCOUNTER — Ambulatory Visit (INDEPENDENT_AMBULATORY_CARE_PROVIDER_SITE_OTHER): Payer: Medicare Other | Admitting: Family Medicine

## 2022-07-16 ENCOUNTER — Encounter: Payer: Self-pay | Admitting: Family Medicine

## 2022-07-16 VITALS — BP 122/76 | HR 72 | Temp 97.2°F | Ht 61.0 in | Wt 211.0 lb

## 2022-07-16 DIAGNOSIS — Z23 Encounter for immunization: Secondary | ICD-10-CM

## 2022-07-16 DIAGNOSIS — E785 Hyperlipidemia, unspecified: Secondary | ICD-10-CM

## 2022-07-16 DIAGNOSIS — I10 Essential (primary) hypertension: Secondary | ICD-10-CM | POA: Diagnosis not present

## 2022-07-16 DIAGNOSIS — E119 Type 2 diabetes mellitus without complications: Secondary | ICD-10-CM | POA: Diagnosis not present

## 2022-07-16 LAB — BASIC METABOLIC PANEL
BUN: 26 mg/dL — ABNORMAL HIGH (ref 6–23)
CO2: 26 mEq/L (ref 19–32)
Calcium: 10.9 mg/dL — ABNORMAL HIGH (ref 8.4–10.5)
Chloride: 103 mEq/L (ref 96–112)
Creatinine, Ser: 1 mg/dL (ref 0.40–1.20)
GFR: 59.93 mL/min — ABNORMAL LOW (ref >60.00)
Glucose, Bld: 105 mg/dL — ABNORMAL HIGH (ref 70–99)
Potassium: 4.7 mEq/L (ref 3.5–5.1)
Sodium: 138 mEq/L (ref 135–145)

## 2022-07-16 LAB — URINALYSIS, ROUTINE W REFLEX MICROSCOPIC
Bilirubin Urine: NEGATIVE
Hgb urine dipstick: NEGATIVE
Ketones, ur: NEGATIVE
Leukocytes,Ua: NEGATIVE
Nitrite: NEGATIVE
RBC / HPF: NONE SEEN (ref 0–?)
Specific Gravity, Urine: 1.025 (ref 1.000–1.030)
Total Protein, Urine: NEGATIVE
Urine Glucose: NEGATIVE
Urobilinogen, UA: 0.2 (ref 0.0–1.0)
pH: 5.5 (ref 5.0–8.0)

## 2022-07-16 LAB — MICROALBUMIN / CREATININE URINE RATIO
Creatinine,U: 122.4 mg/dL
Microalb Creat Ratio: 0.6 mg/g (ref 0.0–30.0)
Microalb, Ur: <0.7 mg/dL (ref 0.0–1.9)

## 2022-07-16 LAB — HEMOGLOBIN A1C: Hgb A1c MFr Bld: 7.9 % — ABNORMAL HIGH (ref 4.6–6.5)

## 2022-07-16 MED ORDER — METFORMIN HCL ER 750 MG PO TB24: 1500.0000 mg | ORAL_TABLET | Freq: Every day | ORAL | 3 refills | Status: DC

## 2022-07-16 NOTE — Progress Notes (Signed)
[facility] [address] [place] [address] Dept: [phone] Dept Fax: [phone]  Chronic Care Office Visit  Subjective:    Patient ID: Laura Campbell, female    DOB: Nov 21, 1960, 61 y.o..   MRN: [id number]  Chief Complaint  Patient presents with   Follow-up    3 month f/u.      History of Present Illness:  Patient is in today for reassessment of chronic medical issues.  Laura Campbell has a history of Type 2 diabetes. At her last visit, I had increased her metformin to 1000 mg bid. She notes that she had increased GI symptoms, so had gone back to taking just 500 mg bid.   Laura Campbell has a history of hypertension that had been difficult to control. She is currently on amlodipine 10 mg daily, HCTZ 25 mg daily, spironolactone 25 mg daily, and telmisartan 80 mg daily.    Laura Campbell has a history of hyperlipidemia and is managed on Crestor 5 mg daily.   Past Medical History: Patient Active Problem List   Diagnosis Date Noted   Morbid obesity with BMI of 40.0-44.9, adult ([place]) 04/15/2022   Protein C deficiency ([place]) 04/15/2022   Protein S deficiency ([place]) 04/15/2022   History of colon polyps 12/11/2021   Diverticulosis 12/11/2021   Abnormal stool test- Positive Cologuard 06/03/2021   Lumbar radiculopathy, chronic 05/07/2021   Spinal stenosis, lumbar region without neurogenic claudication 05/07/2021   Essential hypertension 05/07/2021   History of DVT (deep vein thrombosis) 05/07/2021   History of pulmonary embolus (PE) 05/07/2021   Hyperlipidemia 05/07/2021   Chronic low back pain 05/07/2021   Hereditary and idiopathic peripheral neuropathy 03/27/2015   Left foot pain 03/27/2015   Type 2 diabetes mellitus ([place]) 03/27/2015   Past Surgical History:  Procedure Laterality Date   ABDOMINAL HYSTERECTOMY  10/06/2008   TONSILLECTOMY     Family History  Problem Relation Age of Onset   Heart disease Mother     Cirrhosis Father    Stroke Sister    Diabetes Sister    Cancer Sister        Breast   Breast cancer Maternal Aunt    Breast cancer Cousin    Neuropathy Neg Hx    Colon cancer Neg Hx    Esophageal cancer Neg Hx    Liver cancer Neg Hx    Pancreatic cancer Neg Hx    Outpatient Medications Prior to Visit  Medication Sig Dispense Refill   amLODipine (NORVASC) 10 MG tablet Take 1 tablet (10 mg total) by mouth daily. 90 tablet 3   apixaban (ELIQUIS) 2.5 MG TABS tablet Take 1 tablet (2.5 mg total) by mouth 2 (two) times daily. 60 tablet 11   Blood Glucose Monitoring Suppl (ONE TOUCH ULTRA MINI) W/DEVICE KIT      hydrochlorothiazide (HYDRODIURIL) 25 MG tablet Take 1 tablet (25 mg total) by mouth daily. 90 tablet 3   metFORMIN (GLUCOPHAGE) 1000 MG tablet Take 1 tablet (1,000 mg total) by mouth 2 (two) times daily with a meal. 180 tablet 3   ONE TOUCH ULTRA TEST test strip      rosuvastatin (CRESTOR) 5 MG tablet Take 1 tablet (5 mg total) by mouth daily. 90 tablet 3   spironolactone (ALDACTONE) 25 MG tablet Take 1 tablet (25 mg total) by mouth every morning. 90 tablet 3   telmisartan (MICARDIS) 80 MG tablet Take 1 tablet (80 mg total) by mouth daily. 90 tablet 3  No facility-administered medications prior to visit.   No Known Allergies    Objective:   Today's Vitals   07/16/22 1021  BP: 122/76  Pulse: 72  Temp: (!) 97.2 F (36.2 C)  TempSrc: Temporal  SpO2: 93%  Weight: 211 lb (95.7 kg)  Height: _0  (1.549 m)   Body mass index is 39.87 kg/m.   General: Well developed, well nourished. No acute distress. Feet- Skin intact, but with mild dryness and callus formation. No sign of maceration between toes.   Nails are normal. Dorsalis pedis and posterior tibial artery pulses are normal. 5.07 monofilament   testing normal. Psych: Alert and oriented. Normal mood and affect.  Health Maintenance Due  Topic Date Due   INFLUENZA VACCINE  05/06/2022   FOOT EXAM  05/07/2022   Diabetic  kidney evaluation - GFR measurement  05/13/2022   Diabetic kidney evaluation - Urine ACR  05/13/2022     Assessment & Plan:   1. Type 2 diabetes mellitus without complication, without long-term current use of insulin (HCC) We will check an A1c and obtain annual kidney screening labs. I will try Laura Campbell on the XR formulation of metformin to see if she tolerates this better.  - Microalbumin / creatinine urine ratio - Basic metabolic panel - Hemoglobin A1c - Urinalysis, Routine w reflex microscopic - metFORMIN (GLUCOPHAGE-XR) 750 MG 24 hr tablet; Take 2 tablets (1,500 mg total) by mouth daily with breakfast.  Dispense: 180 tablet; Refill: 3  2. Essential hypertension Blood pressure is at goal. Continue amlodipine 10 mg daily, HCTZ 25 mg daily, and telmisartan 80 mg daily.  3. Hyperlipidemia, unspecified hyperlipidemia type Lipids have been at goal. Continue rosuvastatin 5 mg daily.  4. Need for influenza vaccination  - Flu Vaccine QUAD 6+ mos PF IM (Fluarix Quad PF)  Return in about 3 months (around 10/16/2022) for Reassessment.   Haydee Salter, MD

## 2022-07-21 DIAGNOSIS — M545 Low back pain, unspecified: Secondary | ICD-10-CM | POA: Diagnosis not present

## 2022-07-22 ENCOUNTER — Ambulatory Visit (INDEPENDENT_AMBULATORY_CARE_PROVIDER_SITE_OTHER): Payer: Medicare Other

## 2022-07-22 VITALS — Ht 61.0 in | Wt 210.0 lb

## 2022-07-22 DIAGNOSIS — Z0000 Encounter for general adult medical examination without abnormal findings: Secondary | ICD-10-CM

## 2022-07-22 NOTE — Patient Instructions (Signed)
Laura Campbell , Thank you for taking time to come for your Medicare Wellness Visit. I appreciate your ongoing commitment to your health goals. Please review the following plan we discussed and let me know if I can assist you in the future.   These are the goals we discussed:  Goals      Exercise 3x per week (30 min per time)     Weight (lb) < 200 lb (90.7 kg)     Patient would like to loose weight        This is a list of the screening recommended for you and due dates:  Health Maintenance  Topic Date Due   Mammogram  07/18/2022   Eye exam for diabetics  08/06/2022   Hemoglobin A1C  01/15/2023   Yearly kidney function blood test for diabetes  07/17/2023   Yearly kidney health urinalysis for diabetes  07/17/2023   Complete foot exam   07/17/2023   Cologuard (Stool DNA test)  05/26/2024   Tetanus Vaccine  05/08/2031   Flu Shot  Completed   Hepatitis C Screening: USPSTF Recommendation to screen - Ages 18-79 yo.  Completed   HIV Screening  Completed   HPV Vaccine  Aged Out   Pap Smear  Discontinued   Colon Cancer Screening  Discontinued   COVID-19 Vaccine  Discontinued   Zoster (Shingles) Vaccine  Discontinued    Advanced directives: Advance directive discussed with you today. I have provided a copy for you to complete at home and have notarized. Once this is complete please bring a copy in to our office so we can scan it into your chart.   Conditions/risks identified: Aim for 30 minutes of exercise or brisk walking, 6-8 glasses of water, and 5 servings of fruits and vegetables each day.   Next appointment: Follow up in one year for your annual wellness visit.   Preventive Care 40-64 Years, Female Preventive care refers to lifestyle choices and visits with your health care provider that can promote health and wellness. What does preventive care include? A yearly physical exam. This is also called an annual well check. Dental exams once or twice a year. Routine eye exams. Ask  your health care provider how often you should have your eyes checked. Personal lifestyle choices, including: Daily care of your teeth and gums. Regular physical activity. Eating a healthy diet. Avoiding tobacco and drug use. Limiting alcohol use. Practicing safe sex. Taking low-dose aspirin daily starting at age 35. Taking vitamin and mineral supplements as recommended by your health care provider. What happens during an annual well check? The services and screenings done by your health care provider during your annual well check will depend on your age, overall health, lifestyle risk factors, and family history of disease. Counseling  Your health care provider may ask you questions about your: Alcohol use. Tobacco use. Drug use. Emotional well-being. Home and relationship well-being. Sexual activity. Eating habits. Work and work Statistician. Method of birth control. Menstrual cycle. Pregnancy history. Screening  You may have the following tests or measurements: Height, weight, and BMI. Blood pressure. Lipid and cholesterol levels. These may be checked every 5 years, or more frequently if you are over 34 years old. Skin check. Lung cancer screening. You may have this screening every year starting at age 29 if you have a 30-pack-year history of smoking and currently smoke or have quit within the past 15 years. Fecal occult blood test (FOBT) of the stool. You may have this test every  year starting at age 72. Flexible sigmoidoscopy or colonoscopy. You may have a sigmoidoscopy every 5 years or a colonoscopy every 10 years starting at age 1. Hepatitis C blood test. Hepatitis B blood test. Sexually transmitted disease (STD) testing. Diabetes screening. This is done by checking your blood sugar (glucose) after you have not eaten for a while (fasting). You may have this done every 1-3 years. Mammogram. This may be done every 1-2 years. Talk to your health care provider about when you  should start having regular mammograms. This may depend on whether you have a family history of breast cancer. BRCA-related cancer screening. This may be done if you have a family history of breast, ovarian, tubal, or peritoneal cancers. Pelvic exam and Pap test. This may be done every 3 years starting at age 32. Starting at age 91, this may be done every 5 years if you have a Pap test in combination with an HPV test. Bone density scan. This is done to screen for osteoporosis. You may have this scan if you are at high risk for osteoporosis. Discuss your test results, treatment options, and if necessary, the need for more tests with your health care provider. Vaccines  Your health care provider may recommend certain vaccines, such as: Influenza vaccine. This is recommended every year. Tetanus, diphtheria, and acellular pertussis (Tdap, Td) vaccine. You may need a Td booster every 10 years. Zoster vaccine. You may need this after age 78. Pneumococcal 13-valent conjugate (PCV13) vaccine. You may need this if you have certain conditions and were not previously vaccinated. Pneumococcal polysaccharide (PPSV23) vaccine. You may need one or two doses if you smoke cigarettes or if you have certain conditions. Talk to your health care provider about which screenings and vaccines you need and how often you need them. This information is not intended to replace advice given to you by your health care provider. Make sure you discuss any questions you have with your health care provider. Document Released: 10/19/2015 Document Revised: 06/11/2016 Document Reviewed: 07/24/2015 Elsevier Interactive Patient Education  2017 [place] Prevention in the Home Falls can cause injuries. They can happen to people of all ages. There are many things you can do to make your home safe and to help prevent falls. What can I do on the outside of my home? Regularly fix the edges of walkways and driveways and fix  any cracks. Remove anything that might make you trip as you walk through a door, such as a raised step or threshold. Trim any bushes or trees on the path to your home. Use bright outdoor lighting. Clear any walking paths of anything that might make someone trip, such as rocks or tools. Regularly check to see if handrails are loose or broken. Make sure that both sides of any steps have handrails. Any raised decks and porches should have guardrails on the edges. Have any leaves, snow, or ice cleared regularly. Use sand or salt on walking paths during winter. Clean up any spills in your garage right away. This includes oil or grease spills. What can I do in the bathroom? Use night lights. Install grab bars by the toilet and in the tub and shower. Do not use towel bars as grab bars. Use non-skid mats or decals in the tub or shower. If you need to sit down in the shower, use a plastic, non-slip stool. Keep the floor dry. Clean up any water that spills on the floor as soon as it  happens. Remove soap buildup in the tub or shower regularly. Attach bath mats securely with double-sided non-slip rug tape. Do not have throw rugs and other things on the floor that can make you trip. What can I do in the bedroom? Use night lights. Make sure that you have a light by your bed that is easy to reach. Do not use any sheets or blankets that are too big for your bed. They should not hang down onto the floor. Have a firm chair that has side arms. You can use this for support while you get dressed. Do not have throw rugs and other things on the floor that can make you trip. What can I do in the kitchen? Clean up any spills right away. Avoid walking on wet floors. Keep items that you use a lot in easy-to-reach places. If you need to reach something above you, use a strong step stool that has a grab bar. Keep electrical cords out of the way. Do not use floor polish or wax that makes floors slippery. If you  must use wax, use non-skid floor wax. Do not have throw rugs and other things on the floor that can make you trip. What can I do with my stairs? Do not leave any items on the stairs. Make sure that there are handrails on both sides of the stairs and use them. Fix handrails that are broken or loose. Make sure that handrails are as long as the stairways. Check any carpeting to make sure that it is firmly attached to the stairs. Fix any carpet that is loose or worn. Avoid having throw rugs at the top or bottom of the stairs. If you do have throw rugs, attach them to the floor with carpet tape. Make sure that you have a light switch at the top of the stairs and the bottom of the stairs. If you do not have them, ask someone to add them for you. What else can I do to help prevent falls? Wear shoes that: Do not have high heels. Have rubber bottoms. Are comfortable and fit you well. Are closed at the toe. Do not wear sandals. If you use a stepladder: Make sure that it is fully opened. Do not climb a closed stepladder. Make sure that both sides of the stepladder are locked into place. Ask someone to hold it for you, if possible. Clearly mark and make sure that you can see: Any grab bars or handrails. First and last steps. Where the edge of each step is. Use tools that help you move around (mobility aids) if they are needed. These include: Canes. Walkers. Scooters. Crutches. Turn on the lights when you go into a dark area. Replace any light bulbs as soon as they burn out. Set up your furniture so you have a clear path. Avoid moving your furniture around. If any of your floors are uneven, fix them. If there are any pets around you, be aware of where they are. Review your medicines with your doctor. Some medicines can make you feel dizzy. This can increase your chance of falling. Ask your doctor what other things that you can do to help prevent falls. This information is not intended to replace  advice given to you by your health care provider. Make sure you discuss any questions you have with your health care provider. Document Released: 07/19/2009 Document Revised: 02/28/2016 Document Reviewed: 10/27/2014 Elsevier Interactive Patient Education  2017 [employer].
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
## 2022-07-29 ENCOUNTER — Other Ambulatory Visit: Payer: Self-pay

## 2022-07-29 DIAGNOSIS — E785 Hyperlipidemia, unspecified: Secondary | ICD-10-CM

## 2022-07-29 DIAGNOSIS — E119 Type 2 diabetes mellitus without complications: Secondary | ICD-10-CM

## 2022-07-29 DIAGNOSIS — D6859 Other primary thrombophilia: Secondary | ICD-10-CM

## 2022-07-29 DIAGNOSIS — I10 Essential (primary) hypertension: Secondary | ICD-10-CM

## 2022-07-29 DIAGNOSIS — Z86711 Personal history of pulmonary embolism: Secondary | ICD-10-CM

## 2022-07-29 DIAGNOSIS — Z86718 Personal history of other venous thrombosis and embolism: Secondary | ICD-10-CM

## 2022-07-29 MED ORDER — METFORMIN HCL ER 750 MG PO TB24: 1500.0000 mg | ORAL_TABLET | Freq: Every day | ORAL | 2 refills | Status: DC

## 2022-07-29 MED ORDER — AMLODIPINE BESYLATE 10 MG PO TABS: 10.0000 mg | ORAL_TABLET | Freq: Every day | ORAL | 2 refills | Status: DC

## 2022-07-29 MED ORDER — APIXABAN 2.5 MG PO TABS: 2.5000 mg | ORAL_TABLET | Freq: Two times a day (BID) | ORAL | 2 refills | Status: DC

## 2022-07-29 MED ORDER — TELMISARTAN 80 MG PO TABS: 80.0000 mg | ORAL_TABLET | Freq: Every day | ORAL | 2 refills | Status: DC

## 2022-07-29 MED ORDER — HYDROCHLOROTHIAZIDE 25 MG PO TABS: 25.0000 mg | ORAL_TABLET | Freq: Every day | ORAL | 2 refills | Status: DC

## 2022-07-29 MED ORDER — SPIRONOLACTONE 25 MG PO TABS: 25.0000 mg | ORAL_TABLET | Freq: Every morning | ORAL | 2 refills | Status: DC

## 2022-07-29 MED ORDER — ROSUVASTATIN CALCIUM 5 MG PO TABS: 5.0000 mg | ORAL_TABLET | Freq: Every day | ORAL | 2 refills | Status: DC

## 2022-07-29 NOTE — Telephone Encounter (Signed)
Rx sent to mail order per receiving a fax with patient requesting [place] to do so. Dm/cma

## 2022-08-09 DIAGNOSIS — H524 Presbyopia: Secondary | ICD-10-CM | POA: Diagnosis not present

## 2022-08-09 DIAGNOSIS — E119 Type 2 diabetes mellitus without complications: Secondary | ICD-10-CM | POA: Diagnosis not present

## 2022-08-09 LAB — HM DIABETES EYE EXAM

## 2022-08-18 ENCOUNTER — Encounter: Payer: Self-pay | Admitting: Family Medicine

## 2022-10-16 ENCOUNTER — Ambulatory Visit: Payer: Medicare Other | Admitting: Family Medicine

## 2022-10-20 ENCOUNTER — Ambulatory Visit (INDEPENDENT_AMBULATORY_CARE_PROVIDER_SITE_OTHER): Payer: Medicare Other | Admitting: Family Medicine

## 2022-10-20 VITALS — BP 120/68 | HR 58 | Temp 97.6°F | Ht 61.0 in | Wt 215.4 lb

## 2022-10-20 DIAGNOSIS — E119 Type 2 diabetes mellitus without complications: Secondary | ICD-10-CM | POA: Diagnosis not present

## 2022-10-20 DIAGNOSIS — D6859 Other primary thrombophilia: Secondary | ICD-10-CM | POA: Diagnosis not present

## 2022-10-20 DIAGNOSIS — I10 Essential (primary) hypertension: Secondary | ICD-10-CM

## 2022-10-20 MED ORDER — PIOGLITAZONE HCL 30 MG PO TABS: 30.0000 mg | ORAL_TABLET | Freq: Every day | ORAL | 3 refills | Status: DC

## 2022-10-20 NOTE — Progress Notes (Signed)
[facility] [address] [place] [address] Dept: [phone] Dept Fax: [phone]  Chronic Care Office Visit  Subjective:    Patient ID: KANG ISHIDA, female    DOB: [birth year], 63 y.o..   MRN: [id number]  Chief Complaint  Patient presents with   Follow-up    3 month f/u.  No concerns.  No fasting.      History of Present Illness:  Patient is in today for reassessment of chronic medical issues.  Ms. Bousquet has a history of Type 2 diabetes. At her last visit, I had increased her metformin to 1000 mg bid. She noted that she had increased GI symptoms, so had gone back to taking just 500 mg bid. I tried switching her to the XR formulation. She notes the GI symptoms continued, so stopped taking this medicine. She notes she was tried on empagliflozin in the past, but had significant lightheadedness on this.   Ms. Badia has a history of hypertension that had been difficult to control. She is currently on amlodipine 10 mg daily, HCTZ 25 mg daily, spironolactone 25 mg daily, and telmisartan 80 mg daily.    Ms. Castleman has a history of hyperlipidemia and is managed on Crestor 5 mg daily.  Ms. Virgia Land has a history of past DVT and pulmonary emboli. She was found to have a Protein C and S deficiency. She is on apixiban 2.5 mg bid for prevention of further clots.  Past Medical History: Patient Active Problem List   Diagnosis Date Noted   Morbid obesity with BMI of 40.0-44.9, adult ([place]) 04/15/2022   Protein C deficiency ([place]) 04/15/2022   Protein S deficiency ([place]) 04/15/2022   History of colon polyps 12/11/2021   Diverticulosis 12/11/2021   Abnormal stool test- Positive Cologuard 06/03/2021   Lumbar radiculopathy, chronic 05/07/2021   Spinal stenosis, lumbar region without neurogenic claudication 05/07/2021   Essential hypertension 05/07/2021   History of DVT (deep vein thrombosis) 05/07/2021   History of pulmonary  embolus (PE) 05/07/2021   Hyperlipidemia 05/07/2021   Chronic low back pain 05/07/2021   Hereditary and idiopathic peripheral neuropathy 03/27/2015   Left foot pain 03/27/2015   Type 2 diabetes mellitus ([place]) 03/27/2015    Past Surgical History:  Procedure Laterality Date   ABDOMINAL HYSTERECTOMY  10/06/2008   TONSILLECTOMY      Family History  Problem Relation Age of Onset   Heart disease Mother    Cirrhosis Father    Stroke Sister    Diabetes Sister    Cancer Sister        Breast   Breast cancer Maternal Aunt    Breast cancer Cousin    Neuropathy Neg Hx    Colon cancer Neg Hx    Esophageal cancer Neg Hx    Liver cancer Neg Hx    Pancreatic cancer Neg Hx     Outpatient Medications Prior to Visit  Medication Sig Dispense Refill   amLODipine (NORVASC) 10 MG tablet Take 1 tablet (10 mg total) by mouth daily. 90 tablet 2   apixaban (ELIQUIS) 2.5 MG TABS tablet Take 1 tablet (2.5 mg total) by mouth 2 (two) times daily. 180 tablet 2   Blood Glucose Monitoring Suppl (ONE TOUCH ULTRA MINI) W/DEVICE KIT      hydrochlorothiazide (HYDRODIURIL) 25 MG tablet Take 1 tablet (25 mg total) by mouth daily. 90 tablet 2   rosuvastatin (CRESTOR) 5 MG tablet Take 1 tablet (5 mg total) by mouth daily. [place]  tablet 2   spironolactone (ALDACTONE) 25 MG tablet Take 1 tablet (25 mg total) by mouth every morning. 90 tablet 2   telmisartan (MICARDIS) 80 MG tablet Take 1 tablet (80 mg total) by mouth daily. 90 tablet 2   metFORMIN (GLUCOPHAGE-XR) 750 MG 24 hr tablet Take 2 tablets (1,500 mg total) by mouth daily with breakfast. 180 tablet 2   ONE TOUCH ULTRA TEST test strip  (Patient not taking: Reported on 10/20/2022)     No facility-administered medications prior to visit.   Allergies  Allergen Reactions   Jardiance [Empagliflozin] Other (See Comments)    Hypotension   Metformin And Related Other (See Comments)    GI Intolerance   Objective:   Today's Vitals   10/20/22 1332  BP: 120/68   Pulse: (!) 58  Temp: 97.6 F (36.4 C)  TempSrc: Temporal  SpO2: 94%  Weight: 215 lb 6.4 oz (97.7 kg)  Height: '5\' 1"'$  (1.549 m)   Body mass index is 40.7 kg/m.   General: Well developed, well nourished. No acute distress. Psych: Alert and oriented. Normal mood and affect.  Health Maintenance Due  Topic Date Due   MAMMOGRAM  07/18/2022     Assessment & Plan:   1. Type 2 diabetes mellitus without complication, without long-term current use of insulin ([place]) Ms. Tabares has not tolerated metformin and previously did not tolerate empagliflozin. I will switch her to pioglitazone. If she does not achieve goal wit this, would consider a DPP-4 or GLP1 RA.  - Glucose, random - Hemoglobin A1c - pioglitazone (ACTOS) 30 MG tablet; Take 1 tablet (30 mg total) by mouth daily.  Dispense: 90 tablet; Refill: 3  2. Essential hypertension Blood pressure is at goal. Continue amlodipine 10 mg daily, HCTZ 25 mg daily, and telmisartan 80 mg daily.   3. Protein C deficiency ([place]) 4. Protein S deficiency (HCC) Continue apixiban 2.5 mg bid.  Return in about 3 months (around 01/19/2023) for Reassessment.   Haydee Salter, MD

## 2022-10-21 LAB — HEMOGLOBIN A1C: Hgb A1c MFr Bld: 8.3 % — ABNORMAL HIGH (ref 4.6–6.5)

## 2022-10-21 LAB — GLUCOSE, RANDOM: Glucose, Bld: 113 mg/dL — ABNORMAL HIGH (ref 70–99)

## 2022-12-03 DIAGNOSIS — M5416 Radiculopathy, lumbar region: Secondary | ICD-10-CM | POA: Diagnosis not present

## 2022-12-29 DIAGNOSIS — K080 Exfoliation of teeth due to systemic causes: Secondary | ICD-10-CM | POA: Diagnosis not present

## 2022-12-30 DIAGNOSIS — Z1231 Encounter for screening mammogram for malignant neoplasm of breast: Secondary | ICD-10-CM | POA: Diagnosis not present

## 2022-12-30 LAB — HM MAMMOGRAPHY

## 2023-01-01 ENCOUNTER — Encounter: Payer: Self-pay | Admitting: Family Medicine

## 2023-01-01 DIAGNOSIS — M25552 Pain in left hip: Secondary | ICD-10-CM | POA: Diagnosis not present

## 2023-01-01 DIAGNOSIS — M5416 Radiculopathy, lumbar region: Secondary | ICD-10-CM | POA: Diagnosis not present

## 2023-01-01 DIAGNOSIS — M25551 Pain in right hip: Secondary | ICD-10-CM | POA: Diagnosis not present

## 2023-01-06 ENCOUNTER — Ambulatory Visit (INDEPENDENT_AMBULATORY_CARE_PROVIDER_SITE_OTHER): Payer: Medicare Other | Admitting: Family Medicine

## 2023-01-06 ENCOUNTER — Encounter: Payer: Self-pay | Admitting: Family Medicine

## 2023-01-06 VITALS — BP 93/55 | HR 68 | Temp 97.6°F | Ht 61.0 in | Wt 232.2 lb

## 2023-01-06 DIAGNOSIS — E785 Hyperlipidemia, unspecified: Secondary | ICD-10-CM

## 2023-01-06 DIAGNOSIS — E119 Type 2 diabetes mellitus without complications: Secondary | ICD-10-CM | POA: Diagnosis not present

## 2023-01-06 DIAGNOSIS — I10 Essential (primary) hypertension: Secondary | ICD-10-CM

## 2023-01-06 MED ORDER — AMLODIPINE BESYLATE 5 MG PO TABS: 5.0000 mg | ORAL_TABLET | Freq: Every day | ORAL | 3 refills | Status: DC

## 2023-01-06 NOTE — Assessment & Plan Note (Signed)
Laura Campbell's BP is low today. She is not symptomatic with this. I do think we should try and reduce her medication. I will have her switch to amlodipine 5 mg daily. She will continue HCTZ 25 mg daily, spironolactone 25 mg daily, and telmisartan 80 mg daily.

## 2023-01-06 NOTE — Assessment & Plan Note (Signed)
Stable.  Continue rosuvastatin 5 mg daily. 

## 2023-01-06 NOTE — Progress Notes (Signed)
[place] [facility] [address] [place] [address] Dept: [phone] Dept Fax: [phone]  Chronic Care Office Visit  Subjective:    Patient ID: GENISHA LAZUR, female    DOB: 1961-09-28, 62 y.o..   MRN: [id number]  Chief Complaint  Patient presents with   Medical Management of Chronic Issues    3 month f/u. No concerns.      History of Present Illness:  Patient is in today for reassessment of chronic medical issues.  Ms. Bungert has a history of Type 2 diabetes. At her last visit, we stopped her metformin due to GI intolerance. I had switched her to taking pioglitazone 30 mg daily. She seems to be tolerating this well currently.   Ms. Mcmorran has a history of hypertension that had been difficult to control. She is currently on amlodipine 10 mg daily, HCTZ 25 mg daily, spironolactone 25 mg daily, and telmisartan 80 mg daily. She is not noting any orthostatic issues.   Ms. Valente has a history of hyperlipidemia and is managed on Crestor 5 mg daily.   Past Medical History: Patient Active Problem List   Diagnosis Date Noted   Morbid obesity with BMI of 40.0-44.9, adult 04/15/2022   Protein C deficiency 04/15/2022   Protein S deficiency 04/15/2022   History of colon polyps 12/11/2021   Diverticulosis 12/11/2021   Abnormal stool test- Positive Cologuard 06/03/2021   Lumbar radiculopathy, chronic 05/07/2021   Spinal stenosis, lumbar region without neurogenic claudication 05/07/2021   Essential hypertension 05/07/2021   History of DVT (deep vein thrombosis) 05/07/2021   History of pulmonary embolus (PE) 05/07/2021   Hyperlipidemia 05/07/2021   Chronic low back pain 05/07/2021   Hereditary and idiopathic peripheral neuropathy 03/27/2015   Left foot pain 03/27/2015   Type 2 diabetes mellitus ([place]) 03/27/2015   Past Surgical History:  Procedure Laterality Date   ABDOMINAL HYSTERECTOMY  10/06/2008   TONSILLECTOMY      Family History  Problem Relation Age of Onset   Heart disease Mother    Cirrhosis Father    Stroke Sister    Diabetes Sister    Cancer Sister        Breast   Breast cancer Maternal Aunt    Breast cancer Cousin    Neuropathy Neg Hx    Colon cancer Neg Hx    Esophageal cancer Neg Hx    Liver cancer Neg Hx    Pancreatic cancer Neg Hx    Outpatient Medications Prior to Visit  Medication Sig Dispense Refill   apixaban (ELIQUIS) 2.5 MG TABS tablet Take 1 tablet (2.5 mg total) by mouth 2 (two) times daily. 180 tablet 2   Blood Glucose Monitoring Suppl (ONE TOUCH ULTRA MINI) W/DEVICE KIT      hydrochlorothiazide (HYDRODIURIL) 25 MG tablet Take 1 tablet (25 mg total) by mouth daily. 90 tablet 2   meloxicam (MOBIC) 15 MG tablet Take 15 mg by mouth daily.     pioglitazone (ACTOS) 30 MG tablet Take 1 tablet (30 mg total) by mouth daily. 90 tablet 3   rosuvastatin (CRESTOR) 5 MG tablet Take 1 tablet (5 mg total) by mouth daily. 90 tablet 2   spironolactone (ALDACTONE) 25 MG tablet Take 1 tablet (25 mg total) by mouth every morning. 90 tablet 2   telmisartan (MICARDIS) 80 MG tablet Take 1 tablet (80 mg total) by mouth daily. 90 tablet 2   amLODipine (NORVASC) 10 MG tablet Take 1 tablet (10 mg total) by  mouth daily. 90 tablet 2   No facility-administered medications prior to visit.   Allergies  Allergen Reactions   Jardiance [Empagliflozin] Other (See Comments)    Hypotension   Metformin And Related Other (See Comments)    GI Intolerance   Objective:   Today's Vitals   01/06/23 1346  BP: (!) 93/55  Pulse: 68  Temp: 97.6 F (36.4 C)  TempSrc: Temporal  SpO2: 97%  Weight: 232 lb 3.2 oz (105.3 kg)  Height: 5\' 1"  (1.549 m)   Body mass index is 43.87 kg/m.   General: Well developed, well nourished. No acute distress. Psych: Alert and oriented. Normal mood and affect.  There are no preventive care reminders to display for this patient.    Assessment & Plan:   Problem List  Items Addressed This Visit       Cardiovascular and Mediastinum   Essential hypertension - Primary    Ms. Vetter's BP is low today. She is not symptomatic with this. I do think we should try and reduce her medication. I will have her switch to amlodipine 5 mg daily. She will continue HCTZ 25 mg daily, spironolactone 25 mg daily, and telmisartan 80 mg daily.      Relevant Medications   amLODipine (NORVASC) 5 MG tablet     Endocrine   Type 2 diabetes mellitus (HCC)    We will check an A1c today. Continue pioglitazone 30 mg daily for now.      Relevant Orders   Glucose, random   Hemoglobin A1c     Other   Hyperlipidemia    Stable. Continue rosuvastatin 5 mg daily.      Relevant Medications   amLODipine (NORVASC) 5 MG tablet    Return in about 3 months (around 04/07/2023) for Reassessment.   Haydee Salter, MD

## 2023-01-06 NOTE — Assessment & Plan Note (Signed)
We will check an A1c today. Continue pioglitazone 30 mg daily for now.

## 2023-01-07 LAB — HEMOGLOBIN A1C: Hgb A1c MFr Bld: 8 % — ABNORMAL HIGH (ref 4.6–6.5)

## 2023-01-07 LAB — GLUCOSE, RANDOM: Glucose, Bld: 74 mg/dL (ref 70–99)

## 2023-01-07 MED ORDER — PIOGLITAZONE HCL 45 MG PO TABS
45.0000 mg | ORAL_TABLET | Freq: Every day | ORAL | 3 refills | Status: DC
Start: 2023-01-07 — End: 2023-02-03

## 2023-01-07 NOTE — Addendum Note (Signed)
Addended by: Haydee Salter on: 01/07/2023 11:31 AM   Modules accepted: Orders

## 2023-01-12 DIAGNOSIS — M545 Low back pain, unspecified: Secondary | ICD-10-CM | POA: Diagnosis not present

## 2023-01-15 DIAGNOSIS — M5416 Radiculopathy, lumbar region: Secondary | ICD-10-CM | POA: Diagnosis not present

## 2023-01-28 DIAGNOSIS — M25511 Pain in right shoulder: Secondary | ICD-10-CM | POA: Diagnosis not present

## 2023-01-28 DIAGNOSIS — M542 Cervicalgia: Secondary | ICD-10-CM | POA: Diagnosis not present

## 2023-02-02 ENCOUNTER — Telehealth: Payer: Self-pay | Admitting: Family Medicine

## 2023-02-02 NOTE — Telephone Encounter (Signed)
Pt is wanting a cb concerning her feet swelling. Please advise pt at [phone]

## 2023-02-03 ENCOUNTER — Ambulatory Visit (INDEPENDENT_AMBULATORY_CARE_PROVIDER_SITE_OTHER): Payer: Medicare Other | Admitting: Family Medicine

## 2023-02-03 ENCOUNTER — Encounter: Payer: Self-pay | Admitting: Family Medicine

## 2023-02-03 VITALS — BP 104/60 | HR 82 | Temp 98.0°F | Ht 61.0 in | Wt 240.8 lb

## 2023-02-03 DIAGNOSIS — E119 Type 2 diabetes mellitus without complications: Secondary | ICD-10-CM | POA: Diagnosis not present

## 2023-02-03 DIAGNOSIS — Z7984 Long term (current) use of oral hypoglycemic drugs: Secondary | ICD-10-CM

## 2023-02-03 DIAGNOSIS — R600 Localized edema: Secondary | ICD-10-CM

## 2023-02-03 DIAGNOSIS — I10 Essential (primary) hypertension: Secondary | ICD-10-CM

## 2023-02-03 MED ORDER — SITAGLIPTIN PHOSPHATE 100 MG PO TABS
100.0000 mg | ORAL_TABLET | Freq: Every day | ORAL | 11 refills | Status: DC
Start: 2023-02-03 — End: 2023-04-07

## 2023-02-03 MED ORDER — FUROSEMIDE 20 MG PO TABS
20.0000 mg | ORAL_TABLET | Freq: Every day | ORAL | 0 refills | Status: DC
Start: 2023-02-03 — End: 2023-02-20

## 2023-02-03 NOTE — Progress Notes (Signed)
[facility] [address] [place] [address] Dept: [phone] Dept Fax: [phone]  Office Visit  Subjective:    Patient ID: Laura Campbell, female    DOB: 01-12-1961, 62 y.o..   MRN: [id number]  Chief Complaint  Patient presents with   Shortness of Breath    Patinet complains if shortness of breath, x2 weeks    Edema    Patient complains of leg edema, x2 weeks,    History of Present Illness:  Patient is in today for complaining of a 2 week history of increased swelling in her feet, weight gain, and dyspnea. All of this seems to have developed after her last visit. She has a history of hypertension. However, at the at visit, her BP was low, so I decreased her amlodipine from 10 mg to 5 mg. Her A1c had improved from 8.3 to 8.0%, but was not to goal. I had increased her pioglitazone from 30 to 45 mg daily.  Past Medical History: Patient Active Problem List   Diagnosis Date Noted   Morbid obesity with BMI of 40.0-44.9, adult (HCC) 04/15/2022   Protein C deficiency (HCC) 04/15/2022   Protein S deficiency (HCC) 04/15/2022   History of colon polyps 12/11/2021   Diverticulosis 12/11/2021   Lumbar radiculopathy, chronic 05/07/2021   Spinal stenosis, lumbar region without neurogenic claudication 05/07/2021   Essential hypertension 05/07/2021   History of DVT (deep vein thrombosis) 05/07/2021   History of pulmonary embolus (PE) 05/07/2021   Hyperlipidemia 05/07/2021   Chronic low back pain 05/07/2021   Hereditary and idiopathic peripheral neuropathy 03/27/2015   Left foot pain 03/27/2015   Type 2 diabetes mellitus (HCC) 03/27/2015   Past Surgical History:  Procedure Laterality Date   ABDOMINAL HYSTERECTOMY  10/06/2008   TONSILLECTOMY     Family History  Problem Relation Age of Onset   Heart disease Mother    Cirrhosis Father    Stroke Sister    Diabetes Sister    Cancer Sister        Breast   Breast cancer  Maternal Aunt    Breast cancer Cousin    Neuropathy Neg Hx    Colon cancer Neg Hx    Esophageal cancer Neg Hx    Liver cancer Neg Hx    Pancreatic cancer Neg Hx    Outpatient Medications Prior to Visit  Medication Sig Dispense Refill   apixaban (ELIQUIS) 2.5 MG TABS tablet Take 1 tablet (2.5 mg total) by mouth 2 (two) times daily. 180 tablet 2   Blood Glucose Monitoring Suppl (ONE TOUCH ULTRA MINI) W/DEVICE KIT      hydrochlorothiazide (HYDRODIURIL) 25 MG tablet Take 1 tablet (25 mg total) by mouth daily. 90 tablet 2   meloxicam (MOBIC) 15 MG tablet Take 15 mg by mouth daily.     rosuvastatin (CRESTOR) 5 MG tablet Take 1 tablet (5 mg total) by mouth daily. 90 tablet 2   spironolactone (ALDACTONE) 25 MG tablet Take 1 tablet (25 mg total) by mouth every morning. 90 tablet 2   telmisartan (MICARDIS) 80 MG tablet Take 1 tablet (80 mg total) by mouth daily. 90 tablet 2   amLODipine (NORVASC) 5 MG tablet Take 1 tablet (5 mg total) by mouth daily. 90 tablet 3   pioglitazone (ACTOS) 45 MG tablet Take 1 tablet (45 mg total) by mouth daily. 90 tablet 3   No facility-administered medications prior to visit.   Allergies  Allergen Reactions   Jardiance [Empagliflozin]  Other (See Comments)    Hypotension   Metformin And Related Other (See Comments)    GI Intolerance     Objective:   Today's Vitals   02/03/23 1408  BP: 104/60  Pulse: 82  Temp: 98 F (36.7 C)  TempSrc: Temporal  SpO2: 96%  Weight: 240 lb 12.8 oz (109.2 kg)  Height: 5\' 1"  (1.549 m)   Body mass index is 45.5 kg/m.   General: Well developed, well nourished. No acute distress. Lungs: Clear to auscultation bilaterally. No wheezing, rales or rhonchi. CV: RRR with a II/VI systolic murmur. Pulses 2+ bilaterally. Extremities: 2+ edema of lower legs bilaterally. Psych: Alert and oriented. Normal mood and affect.  There are no preventive care reminders to display for this patient.    Assessment & Plan:   Problem List  Items Addressed This Visit       Cardiovascular and Mediastinum   Essential hypertension    Ms. Weidner's BP is remains low today. I recommend she stop her amlodipine (in case this is also contributing to her swelling). She will continue HCTZ 25 mg daily, spironolactone 25 mg daily, and telmisartan 80 mg daily.      Relevant Medications   furosemide (LASIX) 20 MG tablet     Endocrine   Type 2 diabetes mellitus (HCC)    The pioglitazone could be the cause of her edema and dyspnea. I will stop her pioglitazone. She did not tolerate either metformin or empagliflozin in the past. I will ry starting her on sitagliptin instead.      Relevant Medications   sitaGLIPtin (JANUVIA) 100 MG tablet     Other   Pedal edema - Primary    Suspect that pioglitazone is the main cause. I will stop this. I will have her take Lasix 20 mg daily for 7 days. I will plan to see her back in 2 weeks to reassess how she is doing. If her edema and dyspnea persists, would consider cardiology assessment.      Relevant Medications   furosemide (LASIX) 20 MG tablet    Return in about 2 weeks (around 02/17/2023) for Reassessment.   Loyola Mast, MD

## 2023-02-03 NOTE — Assessment & Plan Note (Signed)
The pioglitazone could be the cause of her edema and dyspnea. I will stop her pioglitazone. She did not tolerate either metformin or empagliflozin in the past. I will ry starting her on sitagliptin instead.

## 2023-02-03 NOTE — Progress Notes (Signed)
[facility] [address] [place] [address] Dept: [phone] Dept Fax: [phone]  Chronic Care Office Visit  Subjective:    Patient ID: Laura Campbell, female    DOB: 03-13-1961, 62 y.o..   MRN: [id number]  Chief Complaint  Patient presents with   Shortness of Breath    Patinet complains if shortness of breath, x2 weeks    Edema    Patient complains of leg edema, x2 weeks,    History of Present Illness:  Patient is in today for reassessment of chronic medical issues.  Past Medical History: Patient Active Problem List   Diagnosis Date Noted   Morbid obesity with BMI of 40.0-44.9, adult (HCC) 04/15/2022   Protein C deficiency (HCC) 04/15/2022   Protein S deficiency (HCC) 04/15/2022   History of colon polyps 12/11/2021   Diverticulosis 12/11/2021   Lumbar radiculopathy, chronic 05/07/2021   Spinal stenosis, lumbar region without neurogenic claudication 05/07/2021   Essential hypertension 05/07/2021   History of DVT (deep vein thrombosis) 05/07/2021   History of pulmonary embolus (PE) 05/07/2021   Hyperlipidemia 05/07/2021   Chronic low back pain 05/07/2021   Hereditary and idiopathic peripheral neuropathy 03/27/2015   Left foot pain 03/27/2015   Type 2 diabetes mellitus (HCC) 03/27/2015   Past Surgical History:  Procedure Laterality Date   ABDOMINAL HYSTERECTOMY  10/06/2008   TONSILLECTOMY     Family History  Problem Relation Age of Onset   Heart disease Mother    Cirrhosis Father    Stroke Sister    Diabetes Sister    Cancer Sister        Breast   Breast cancer Maternal Aunt    Breast cancer Cousin    Neuropathy Neg Hx    Colon cancer Neg Hx    Esophageal cancer Neg Hx    Liver cancer Neg Hx    Pancreatic cancer Neg Hx    Outpatient Medications Prior to Visit  Medication Sig Dispense Refill   apixaban (ELIQUIS) 2.5 MG TABS tablet Take 1 tablet (2.5 mg total) by mouth 2 (two) times daily. 180  tablet 2   Blood Glucose Monitoring Suppl (ONE TOUCH ULTRA MINI) W/DEVICE KIT      hydrochlorothiazide (HYDRODIURIL) 25 MG tablet Take 1 tablet (25 mg total) by mouth daily. 90 tablet 2   meloxicam (MOBIC) 15 MG tablet Take 15 mg by mouth daily.     rosuvastatin (CRESTOR) 5 MG tablet Take 1 tablet (5 mg total) by mouth daily. 90 tablet 2   spironolactone (ALDACTONE) 25 MG tablet Take 1 tablet (25 mg total) by mouth every morning. 90 tablet 2   telmisartan (MICARDIS) 80 MG tablet Take 1 tablet (80 mg total) by mouth daily. 90 tablet 2   amLODipine (NORVASC) 5 MG tablet Take 1 tablet (5 mg total) by mouth daily. 90 tablet 3   pioglitazone (ACTOS) 45 MG tablet Take 1 tablet (45 mg total) by mouth daily. 90 tablet 3   No facility-administered medications prior to visit.   Allergies  Allergen Reactions   Jardiance [Empagliflozin] Other (See Comments)    Hypotension   Metformin And Related Other (See Comments)    GI Intolerance   Objective:   Today's Vitals   02/03/23 1408  BP: 104/60  Pulse: 82  Temp: 98 F (36.7 C)  TempSrc: Temporal  SpO2: 96%  Weight: 240 lb 12.8 oz (109.2 kg)  Height: 5\' 1"  (1.549 m)   Body mass index is 45.5  kg/m.   General: Well developed, well nourished. No acute distress. HEENT: Normocephalic, non-traumatic. External ears normal. EAC and TMs normal bilaterally. PERRL, EOMI. Conjunctiva clear. Nose   clear without congestion or rhinorrhea. Mucous membranes moist. Oropharynx clear. Good dentition. Neck: Supple. No lymphadenopathy. No thyromegaly. Lungs: Clear to auscultation bilaterally. No wheezing, rales or rhonchi. CV: RRR without murmurs or rubs. Pulses 2+ bilaterally. Abdomen: Soft, non-tender. Bowel sounds positive, normal pitch and frequency. No hepatosplenomegaly. No rebound or guarding. Back: Straight. No CVA tenderness bilaterally. Extremities: Full ROM. No joint swelling or tenderness. No edema noted. Skin: Warm and dry. No rashes. Neuro: CN  II-XII intact. Normal sensation and DTR bilaterally. Psych: Alert and oriented. Normal mood and affect.  There are no preventive care reminders to display for this patient.  Lab Results     Assessment & Plan:   Problem List Items Addressed This Visit       Cardiovascular and Mediastinum   Essential hypertension   Relevant Medications   furosemide (LASIX) 20 MG tablet     Endocrine   Type 2 diabetes mellitus (HCC)   Relevant Medications   sitaGLIPtin (JANUVIA) 100 MG tablet   Other Visit Diagnoses     Pedal edema    -  Primary   Relevant Medications   furosemide (LASIX) 20 MG tablet       Return in about 2 weeks (around 02/17/2023) for Reassessment.   Loyola Mast, MD

## 2023-02-03 NOTE — Assessment & Plan Note (Signed)
Suspect that pioglitazone is the main cause. I will stop this. I will have her take Lasix 20 mg daily for 7 days. I will plan to see her back in 2 weeks to reassess how she is doing. If her edema and dyspnea persists, would consider cardiology assessment.

## 2023-02-03 NOTE — Telephone Encounter (Signed)
Lft VM to call Dm/cma ? ?

## 2023-02-03 NOTE — Telephone Encounter (Signed)
Spoke top patient today when she came into the office about 10:30 am,  she is having swelling in both feet/ankles and some SOB x 3 weeks.  (She thinks it might maybe a medication issue.)  Scheduled her an appointment today at 2 pm to be seen. Dm/cma

## 2023-02-03 NOTE — Assessment & Plan Note (Signed)
Ms. Querry's BP is remains low today. I recommend she stop her amlodipine (in case this is also contributing to her swelling). She will continue HCTZ 25 mg daily, spironolactone 25 mg daily, and telmisartan 80 mg daily.

## 2023-02-12 DIAGNOSIS — H524 Presbyopia: Secondary | ICD-10-CM | POA: Diagnosis not present

## 2023-02-18 DIAGNOSIS — M5416 Radiculopathy, lumbar region: Secondary | ICD-10-CM | POA: Diagnosis not present

## 2023-02-20 ENCOUNTER — Telehealth: Payer: Self-pay

## 2023-02-20 ENCOUNTER — Ambulatory Visit (INDEPENDENT_AMBULATORY_CARE_PROVIDER_SITE_OTHER): Payer: Medicare Other | Admitting: Family Medicine

## 2023-02-20 ENCOUNTER — Encounter: Payer: Self-pay | Admitting: Family Medicine

## 2023-02-20 VITALS — BP 120/76 | HR 68 | Temp 97.9°F | Ht 61.0 in | Wt 228.0 lb

## 2023-02-20 DIAGNOSIS — Z7984 Long term (current) use of oral hypoglycemic drugs: Secondary | ICD-10-CM

## 2023-02-20 DIAGNOSIS — E119 Type 2 diabetes mellitus without complications: Secondary | ICD-10-CM

## 2023-02-20 DIAGNOSIS — I10 Essential (primary) hypertension: Secondary | ICD-10-CM | POA: Diagnosis not present

## 2023-02-20 DIAGNOSIS — R600 Localized edema: Secondary | ICD-10-CM | POA: Diagnosis not present

## 2023-02-20 DIAGNOSIS — J029 Acute pharyngitis, unspecified: Secondary | ICD-10-CM | POA: Insufficient documentation

## 2023-02-20 LAB — BASIC METABOLIC PANEL
BUN: 37 mg/dL — ABNORMAL HIGH (ref 6–23)
CO2: 24 mEq/L (ref 19–32)
Calcium: 10.3 mg/dL (ref 8.4–10.5)
Chloride: 109 mEq/L (ref 96–112)
Creatinine, Ser: 1.36 mg/dL — ABNORMAL HIGH (ref 0.40–1.20)
GFR: 41.26 mL/min — ABNORMAL LOW (ref >60.00)
Glucose, Bld: 113 mg/dL — ABNORMAL HIGH (ref 70–99)
Potassium: 4.4 mEq/L (ref 3.5–5.1)
Sodium: 143 mEq/L (ref 135–145)

## 2023-02-20 MED ORDER — LANCET DEVICE MISC
1.0000 | Freq: Three times a day (TID) | 0 refills | Status: AC
Start: 2023-02-20 — End: 2023-03-22

## 2023-02-20 MED ORDER — BLOOD GLUCOSE TEST VI STRP
1.0000 | ORAL_STRIP | 3 refills | Status: DC
Start: 2023-02-20 — End: 2024-05-17

## 2023-02-20 MED ORDER — BLOOD GLUCOSE MONITORING SUPPL DEVI
1.0000 | 0 refills | Status: DC
Start: 2023-02-20 — End: 2024-05-17

## 2023-02-20 MED ORDER — LANCETS MISC. MISC
1.0000 | Freq: Three times a day (TID) | 3 refills | Status: AC
Start: 2023-02-20 — End: 2023-03-22

## 2023-02-20 NOTE — Patient Outreach (Signed)
  Care Coordination   In Person Provider Office Visit Note   02/20/2023 Name: Laura Campbell MRN: [id number] DOB: December 24, 1960  Laura Campbell is a 62 y.o. year old female who sees Rudd, Bertram Millard, MD for primary care. I engaged with Laura Campbell in the providers office today.  What matters to the patients health and wellness today?  none    Goals Addressed             This Visit's Progress    COMPLETED: Care Coordination Activities-No follow up required       Care Coordination Interventions: Advised patient to Annual Wellness exam. Discussed [facility] services and support. Assessed SDOH. Advised to discuss with primary care physician if services needed in the future.         SDOH assessments and interventions completed:  Yes  SDOH Interventions Today    Flowsheet Row Most Recent Value  SDOH Interventions   Housing Interventions Intervention Not Indicated  Transportation Interventions Intervention Not Indicated        Care Coordination Interventions:  Yes, provided   Follow up plan: No further intervention required.   Encounter Outcome:  Pt. Visit Completed   Laura Leriche, RN, MSN [facility] Care Management Care Management Coordinator Direct Line [phone]

## 2023-02-20 NOTE — Patient Instructions (Signed)
Visit Information  Thank you for taking time to visit with me today. Please don't hesitate to contact me if I can be of assistance to you.   Following are the goals we discussed today:   Goals Addressed             This Visit's Progress    COMPLETED: Care Coordination Activities-No follow up required       Care Coordination Interventions: Advised patient to Annual Wellness exam. Discussed [facility] services and support. Assessed SDOH. Advised to discuss with primary care physician if services needed in the future.         If you are experiencing a Mental Health or Behavioral Health Crisis or need someone to talk to, please call the Suicide and Crisis Lifeline: 988   The patient verbalized understanding of instructions, educational materials, and care plan provided today and DECLINED offer to receive copy of patient instructions, educational materials, and care plan.   The patient has been provided with contact information for the care management team and has been advised to call with any health related questions or concerns.   Kathyleen Radice J Adrian Dinovo, RN, MSN [facility] Care Management Care Management Coordinator Direct Line [phone]     

## 2023-02-20 NOTE — Progress Notes (Signed)
[facility] [address] [place] [address] Dept: [phone] Dept Fax: [phone]  Office Visit  Subjective:    Patient ID: Laura Campbell, female    DOB: 07-15-1961, 62 y.o..   MRN: [id number]  Chief Complaint  Patient presents with   Medical Management of Chronic Issues    2 week f/u leg swelling.  Better.    History of Present Illness:  Patient is in today for reassessment of her edema issues. I had seen Laura Campbell on 4/30 with a 2 week history of increased swelling in her feet, weight gain, and dyspnea. Earlier in April I had decreased her amlodipine from 10 mg to 5 mg due some hypotension. Her A1c had improved from 8.3 to 8.0%, but was not to goal, so I had increased her pioglitazone from 30 to 45 mg daily. At the last visit, I had her stop her amlodipine, as her BP was still quite low (104/60). I did continue her on HCTZ 25 mg daily, spironolactone 25 mg daily, and telmisartan 80 mg daily. I added Lasix for 7 days to help relieve her edema. I stopped her pioglitazone and started her on sitagliptin. Laura Campbell notes her swelling has improved and she is feeling better overall. She has had some mild sore throat over the past 24 hours.   Past Medical History: Patient Active Problem List   Diagnosis Date Noted   Pedal edema 02/03/2023   Morbid obesity with BMI of 40.0-44.9, adult (HCC) 04/15/2022   Protein C deficiency (HCC) 04/15/2022   Protein S deficiency (HCC) 04/15/2022   History of colon polyps 12/11/2021   Diverticulosis 12/11/2021   Lumbar radiculopathy, chronic 05/07/2021   Spinal stenosis, lumbar region without neurogenic claudication 05/07/2021   Essential hypertension 05/07/2021   History of DVT (deep vein thrombosis) 05/07/2021   History of pulmonary embolus (PE) 05/07/2021   Hyperlipidemia 05/07/2021   Chronic low back pain 05/07/2021   Hereditary and idiopathic peripheral neuropathy 03/27/2015    Left foot pain 03/27/2015   Type 2 diabetes mellitus (HCC) 03/27/2015   Past Surgical History:  Procedure Laterality Date   ABDOMINAL HYSTERECTOMY  10/06/2008   TONSILLECTOMY     Family History  Problem Relation Age of Onset   Heart disease Mother    Cirrhosis Father    Stroke Sister    Diabetes Sister    Cancer Sister        Breast   Breast cancer Maternal Aunt    Breast cancer Cousin    Neuropathy Neg Hx    Colon cancer Neg Hx    Esophageal cancer Neg Hx    Liver cancer Neg Hx    Pancreatic cancer Neg Hx    Outpatient Medications Prior to Visit  Medication Sig Dispense Refill   apixaban (ELIQUIS) 2.5 MG TABS tablet Take 1 tablet (2.5 mg total) by mouth 2 (two) times daily. 180 tablet 2   Blood Glucose Monitoring Suppl (ONE TOUCH ULTRA MINI) W/DEVICE KIT      furosemide (LASIX) 20 MG tablet Take 1 tablet (20 mg total) by mouth daily. 7 tablet 0   hydrochlorothiazide (HYDRODIURIL) 25 MG tablet Take 1 tablet (25 mg total) by mouth daily. 90 tablet 2   meloxicam (MOBIC) 15 MG tablet Take 15 mg by mouth daily.     rosuvastatin (CRESTOR) 5 MG tablet Take 1 tablet (5 mg total) by mouth daily. 90 tablet 2   sitaGLIPtin (JANUVIA) 100 MG tablet Take 1 tablet (  100 mg total) by mouth daily. 30 tablet 11   spironolactone (ALDACTONE) 25 MG tablet Take 1 tablet (25 mg total) by mouth every morning. 90 tablet 2   telmisartan (MICARDIS) 80 MG tablet Take 1 tablet (80 mg total) by mouth daily. 90 tablet 2   No facility-administered medications prior to visit.   Allergies  Allergen Reactions   Jardiance [Empagliflozin] Other (See Comments)    Hypotension   Metformin And Related Other (See Comments)    GI Intolerance     Objective:   Today's Vitals   02/20/23 1020  BP: 120/76  Pulse: 68  Temp: 97.9 F (36.6 C)  TempSrc: Temporal  SpO2: 100%  Weight: 228 lb (103.4 kg)  Height: 5\' 1"  (1.549 m)   Body mass index is 43.08 kg/m.   General: Well developed, well nourished. No  acute distress. HEENT: Normocephalic, non-traumatic. Mucous membranes moist. Oropharynx clear. Extremities: Full ROM. Trace to 1+ edema of lower legs. Skin: Warm and dry. No rashes. Neuro: CN II-XII intact. Normal sensation and DTR bilaterally. Psych: Alert and oriented. Normal mood and affect.  There are no preventive care reminders to display for this patient.    Assessment & Plan:   Problem List Items Addressed This Visit       Cardiovascular and Mediastinum   Essential hypertension    Laura Campbell's BP is normal today. I recommend she continue HCTZ 25 mg daily, spironolactone 25 mg daily, and telmisartan 80 mg daily.      Relevant Orders   Basic metabolic panel     Respiratory   Viral pharyngitis    Discussed home care for viral illness, including rest, pushing fluids, and OTC medications as needed for symptom relief. Recommend hot tea with honey for sore throat symptoms. Follow-up if needed for worsening or persistent symptoms.         Endocrine   Type 2 diabetes mellitus (HCC)    Now on sitagliptin 100 mg daily. Patient requests a new glucose meter and supplies. We will reassess her A1c in July.      Relevant Medications   Blood Glucose Monitoring Suppl DEVI   Glucose Blood (BLOOD GLUCOSE TEST STRIPS) STRP   Lancet Device MISC   Lancets Misc. MISC     Other   Pedal edema - Primary    Weight is down 12 lbs and edema is much less. This appears to have been due to pioglitazone use.      Relevant Orders   Basic metabolic panel    Return in about 2 months (around 04/22/2023) for Reassessment.   Loyola Mast, MD

## 2023-02-20 NOTE — Assessment & Plan Note (Signed)
Discussed home care for viral illness, including rest, pushing fluids, and OTC medications as needed for symptom relief. Recommend hot tea with honey for sore throat symptoms. Follow-up if needed for worsening or persistent symptoms.  

## 2023-02-20 NOTE — Assessment & Plan Note (Signed)
Laura Campbell's BP is normal today. I recommend she continue HCTZ 25 mg daily, spironolactone 25 mg daily, and telmisartan 80 mg daily.

## 2023-02-20 NOTE — Assessment & Plan Note (Signed)
Now on sitagliptin 100 mg daily. Patient requests a new glucose meter and supplies. We will reassess her A1c in July.

## 2023-02-20 NOTE — Assessment & Plan Note (Signed)
Weight is down 12 lbs and edema is much less. This appears to have been due to pioglitazone use.

## 2023-02-21 ENCOUNTER — Encounter (HOSPITAL_BASED_OUTPATIENT_CLINIC_OR_DEPARTMENT_OTHER): Payer: Self-pay | Admitting: Emergency Medicine

## 2023-02-21 ENCOUNTER — Other Ambulatory Visit (HOSPITAL_BASED_OUTPATIENT_CLINIC_OR_DEPARTMENT_OTHER): Payer: Self-pay

## 2023-02-21 ENCOUNTER — Other Ambulatory Visit: Payer: Self-pay

## 2023-02-21 ENCOUNTER — Emergency Department (HOSPITAL_BASED_OUTPATIENT_CLINIC_OR_DEPARTMENT_OTHER): Payer: Medicare Other

## 2023-02-21 ENCOUNTER — Emergency Department (HOSPITAL_BASED_OUTPATIENT_CLINIC_OR_DEPARTMENT_OTHER)
Admission: EM | Admit: 2023-02-21 | Discharge: 2023-02-21 | Disposition: A | Discharge status: Home / Self Care | Payer: Medicare Other | Attending: Emergency Medicine | Admitting: Emergency Medicine

## 2023-02-21 DIAGNOSIS — Z7901 Long term (current) use of anticoagulants: Secondary | ICD-10-CM | POA: Insufficient documentation

## 2023-02-21 DIAGNOSIS — Z79899 Other long term (current) drug therapy: Secondary | ICD-10-CM | POA: Insufficient documentation

## 2023-02-21 DIAGNOSIS — J029 Acute pharyngitis, unspecified: Secondary | ICD-10-CM

## 2023-02-21 DIAGNOSIS — I10 Essential (primary) hypertension: Secondary | ICD-10-CM | POA: Insufficient documentation

## 2023-02-21 DIAGNOSIS — R221 Localized swelling, mass and lump, neck: Secondary | ICD-10-CM | POA: Diagnosis not present

## 2023-02-21 DIAGNOSIS — J020 Streptococcal pharyngitis: Secondary | ICD-10-CM

## 2023-02-21 DIAGNOSIS — E119 Type 2 diabetes mellitus without complications: Secondary | ICD-10-CM | POA: Diagnosis not present

## 2023-02-21 HISTORY — DX: Long term (current) use of anticoagulants: Z79.01

## 2023-02-21 HISTORY — DX: Personal history of other drug therapy: Z92.29

## 2023-02-21 HISTORY — DX: Acute embolism and thrombosis of unspecified deep veins of unspecified lower extremity: I82.409

## 2023-02-21 LAB — CBC WITH DIFFERENTIAL/PLATELET
Abs Immature Granulocytes: 0.07 10*3/uL (ref 0.00–0.07)
Basophils Absolute: 0 10*3/uL (ref 0.0–0.1)
Basophils Relative: 0 %
Eosinophils Absolute: 0.3 10*3/uL (ref 0.0–0.5)
Eosinophils Relative: 2 %
HCT: 40.7 % (ref 36.0–46.0)
Hemoglobin: 12.9 g/dL (ref 12.0–15.0)
Immature Granulocytes: 1 %
Lymphocytes Relative: 11 %
Lymphs Abs: 1.5 10*3/uL (ref 0.7–4.0)
MCH: 28.1 pg (ref 26.0–34.0)
MCHC: 31.7 g/dL (ref 30.0–36.0)
MCV: 88.7 fL (ref 80.0–100.0)
Monocytes Absolute: 1.3 10*3/uL — ABNORMAL HIGH (ref 0.1–1.0)
Monocytes Relative: 9 %
Neutro Abs: 10.7 10*3/uL — ABNORMAL HIGH (ref 1.7–7.7)
Neutrophils Relative %: 77 %
Platelets: 215 10*3/uL (ref 150–400)
RBC: 4.59 MIL/uL (ref 3.87–5.11)
RDW: 15.5 % (ref 11.5–15.5)
WBC: 13.8 10*3/uL — ABNORMAL HIGH (ref 4.0–10.5)
nRBC: 0 % (ref 0.0–0.2)

## 2023-02-21 LAB — COMPREHENSIVE METABOLIC PANEL
ALT: 12 U/L (ref 0–44)
AST: 12 U/L — ABNORMAL LOW (ref 15–41)
Albumin: 4.1 g/dL (ref 3.5–5.0)
Alkaline Phosphatase: 58 U/L (ref 38–126)
Anion gap: 8 (ref 5–15)
BUN: 27 mg/dL — ABNORMAL HIGH (ref 8–23)
CO2: 25 mmol/L (ref 22–32)
Calcium: 10.4 mg/dL — ABNORMAL HIGH (ref 8.9–10.3)
Chloride: 106 mmol/L (ref 98–111)
Creatinine, Ser: 1.22 mg/dL — ABNORMAL HIGH (ref 0.44–1.00)
GFR, Estimated: 50 mL/min — ABNORMAL LOW (ref >60)
Glucose, Bld: 125 mg/dL — ABNORMAL HIGH (ref 70–99)
Potassium: 4 mmol/L (ref 3.5–5.1)
Sodium: 139 mmol/L (ref 135–145)
Total Bilirubin: 1 mg/dL (ref 0.3–1.2)
Total Protein: 7.6 g/dL (ref 6.5–8.1)

## 2023-02-21 LAB — GROUP A STREP BY PCR: Group A Strep by PCR: DETECTED — AB

## 2023-02-21 LAB — LACTIC ACID, PLASMA: Lactic Acid, Venous: 0.8 mmol/L (ref 0.5–1.9)

## 2023-02-21 MED ORDER — EPINEPHRINE 0.3 MG/0.3ML IJ SOAJ
0.3000 mg | INTRAMUSCULAR | 0 refills | Status: AC | PRN
  Filled 2023-02-21: qty 2, 30d supply, fill #0

## 2023-02-21 MED ORDER — IOHEXOL 300 MG/ML  SOLN
100.0000 mL | Freq: Once | INTRAMUSCULAR | Status: AC | PRN
  Administered 2023-02-21: 75 mL via INTRAVENOUS

## 2023-02-21 MED ORDER — HYDROCODONE-ACETAMINOPHEN 5-325 MG PO TABS
1.0000 | ORAL_TABLET | ORAL | 0 refills | Status: DC | PRN
  Filled 2023-02-21: qty 10, 2d supply, fill #0

## 2023-02-21 MED ORDER — METHYLPREDNISOLONE SODIUM SUCC 125 MG IJ SOLR
125.0000 mg | Freq: Once | INTRAMUSCULAR | Status: AC
  Administered 2023-02-21: 125 mg via INTRAVENOUS
  Filled 2023-02-21: qty 2

## 2023-02-21 MED ORDER — DIPHENHYDRAMINE HCL 50 MG/ML IJ SOLN
25.0000 mg | Freq: Once | INTRAMUSCULAR | Status: AC
  Administered 2023-02-21: 25 mg via INTRAVENOUS
  Filled 2023-02-21: qty 1

## 2023-02-21 MED ORDER — ONDANSETRON 4 MG PO TBDP
4.0000 mg | ORAL_TABLET | Freq: Three times a day (TID) | ORAL | 0 refills | Status: DC | PRN
  Filled 2023-02-21: qty 20, 7d supply, fill #0

## 2023-02-21 MED ORDER — PENICILLIN V POTASSIUM 500 MG PO TABS
500.0000 mg | ORAL_TABLET | Freq: Two times a day (BID) | ORAL | 0 refills | Status: AC
  Filled 2023-02-21: qty 20, 10d supply, fill #0

## 2023-02-21 MED ORDER — FAMOTIDINE IN NACL 20-0.9 MG/50ML-% IV SOLN
20.0000 mg | Freq: Once | INTRAVENOUS | Status: AC
  Administered 2023-02-21: 20 mg via INTRAVENOUS
  Filled 2023-02-21: qty 50

## 2023-02-21 MED ORDER — EPINEPHRINE 0.3 MG/0.3ML IJ SOAJ
0.3000 mg | Freq: Once | INTRAMUSCULAR | Status: AC
  Administered 2023-02-21: 0.3 mg via INTRAMUSCULAR
  Filled 2023-02-21: qty 0.3

## 2023-02-21 NOTE — Discharge Instructions (Signed)
Your history, exam, workup today revealed you do have strep throat causing pharyngitis and your sore throat and inflammation.  As we were unsure if this was an allergic reaction or not, you were treated with epinephrine and steroids and antihistamines.  I have low suspicion for allergic reaction now however please fill a prescription for EpiPen just in case symptoms recur or worsen.  Please use the antibiotics for the next 10 days and use the nausea medicine to help with symptoms.  Please also use the pain medicine and rest and stay hydrated.  Please follow-up with your primary doctor.  If any symptoms change or worsen acutely, please return to the nearest emergency department.

## 2023-02-21 NOTE — ED Notes (Signed)
Pt given hot tea and water to assess for po challenge

## 2023-02-21 NOTE — ED Provider Notes (Signed)
[place] EMERGENCY DEPARTMENT AT [facility] Provider Note   CSN: [id number] Arrival date & time: 02/21/23  4098     History  No chief complaint on file.   Laura Campbell is a 62 y.o. female.  The history is provided by the patient, medical records and a relative. No language interpreter was used.  Sore Throat This is a new problem. The current episode started yesterday. The problem occurs constantly. The problem has been gradually worsening. Pertinent negatives include no chest pain, no abdominal pain, no headaches and no shortness of breath. Associated symptoms comments: Not short of breath but yes difficulty breathing at times.. Nothing aggravates the symptoms. Nothing relieves the symptoms. She has tried nothing for the symptoms. The treatment provided no relief.       Home Medications Prior to Admission medications   Medication Sig Start Date End Date Taking? Authorizing Provider  apixaban (ELIQUIS) 2.5 MG TABS tablet Take 1 tablet (2.5 mg total) by mouth 2 (two) times daily. 07/29/22   Loyola Mast, MD  Blood Glucose Monitoring Suppl (ONE TOUCH ULTRA MINI) W/DEVICE KIT  03/09/15   [provider]  Blood Glucose Monitoring Suppl DEVI 1 each by Does not apply route every morning. May substitute to any manufacturer covered by patient's insurance. 02/20/23   Loyola Mast, MD  Glucose Blood (BLOOD GLUCOSE TEST STRIPS) STRP 1 each by In Vitro route every morning. May substitute to any manufacturer covered by patient's insurance. 02/20/23   Loyola Mast, MD  hydrochlorothiazide (HYDRODIURIL) 25 MG tablet Take 1 tablet (25 mg total) by mouth daily. 07/29/22   Loyola Mast, MD  Lancet Device MISC 1 each by Does not apply route in the morning, at noon, and at bedtime. May substitute to any manufacturer covered by patient's insurance. 02/20/23 03/22/23  Loyola Mast, MD  Lancets Misc. MISC 1 each by Does not apply route in the morning, at noon, and at bedtime.  May substitute to any manufacturer covered by patient's insurance. 02/20/23 03/22/23  Loyola Mast, MD  meloxicam (MOBIC) 15 MG tablet Take 15 mg by mouth daily. 01/01/23   [provider]  rosuvastatin (CRESTOR) 5 MG tablet Take 1 tablet (5 mg total) by mouth daily. 07/29/22   Loyola Mast, MD  sitaGLIPtin (JANUVIA) 100 MG tablet Take 1 tablet (100 mg total) by mouth daily. 02/03/23   Loyola Mast, MD  spironolactone (ALDACTONE) 25 MG tablet Take 1 tablet (25 mg total) by mouth every morning. 07/29/22   Loyola Mast, MD  telmisartan (MICARDIS) 80 MG tablet Take 1 tablet (80 mg total) by mouth daily. 07/29/22   Loyola Mast, MD      Allergies    Jardiance [empagliflozin], Metformin and related, and Pioglitazone    Review of Systems   Review of Systems  Constitutional:  Negative for chills, diaphoresis, fatigue and fever.  HENT:  Positive for sore throat and trouble swallowing. Negative for congestion and facial swelling.   Eyes:  Negative for visual disturbance.  Respiratory:  Negative for cough, choking, chest tightness, shortness of breath and wheezing.   Cardiovascular:  Negative for chest pain, palpitations and leg swelling.  Gastrointestinal:  Negative for abdominal pain, constipation, diarrhea, nausea and vomiting.  Genitourinary:  Negative for dysuria.  Musculoskeletal:  Negative for back pain, neck pain and neck stiffness.  Skin:  Negative for rash and wound.  Neurological:  Negative for weakness, light-headedness, numbness and headaches.  Psychiatric/Behavioral:  Negative  for agitation and confusion.   All other systems reviewed and are negative.   Physical Exam Updated Vital Signs Pulse 82   Temp 98.7 F (37.1 C) (Oral)   Resp (!) 24   SpO2 97%  Physical Exam Vitals and nursing note reviewed.  Constitutional:      General: She is not in acute distress.    Appearance: She is well-developed. She is not ill-appearing, toxic-appearing or diaphoretic.   HENT:     Head: Normocephalic and atraumatic. No contusion or laceration.     Comments: Difficult to assess posterior oropharynx but what can be seen appears to be edematous.    Nose: No congestion or rhinorrhea.     Mouth/Throat:     Mouth: Mucous membranes are moist. No injury or lacerations.     Pharynx: Pharyngeal swelling present. No oropharyngeal exudate or posterior oropharyngeal erythema.     Tonsils: No tonsillar exudate.  Eyes:     Extraocular Movements: Extraocular movements intact.     Conjunctiva/sclera: Conjunctivae normal.  Neck:     Vascular: No carotid bruit.  Cardiovascular:     Rate and Rhythm: Normal rate and regular rhythm.     Heart sounds: No murmur heard. Pulmonary:     Effort: Pulmonary effort is normal. No respiratory distress.     Breath sounds: Normal breath sounds. No wheezing, rhonchi or rales.  Chest:     Chest wall: No tenderness.  Abdominal:     General: Abdomen is flat.     Palpations: Abdomen is soft.     Tenderness: There is no abdominal tenderness. There is no guarding or rebound.  Musculoskeletal:        General: No swelling.     Cervical back: Neck supple. Tenderness (anterior) present.  Skin:    General: Skin is warm and dry.     Capillary Refill: Capillary refill takes less than 2 seconds.     Findings: No erythema.  Neurological:     Mental Status: She is alert.  Psychiatric:        Mood and Affect: Mood normal.     ED Results / Procedures / Treatments   Labs (all labs ordered are listed, but only abnormal results are displayed) Labs Reviewed  GROUP A STREP BY PCR - Abnormal; Notable for the following components:      Result Value   Group A Strep by PCR DETECTED (*)    All other components within normal limits  CBC WITH DIFFERENTIAL/PLATELET - Abnormal; Notable for the following components:   WBC 13.8 (*)    Neutro Abs 10.7 (*)    Monocytes Absolute 1.3 (*)    All other components within normal limits  COMPREHENSIVE  METABOLIC PANEL - Abnormal; Notable for the following components:   Glucose, Bld 125 (*)    BUN 27 (*)    Creatinine, Ser 1.22 (*)    Calcium 10.4 (*)    AST 12 (*)    GFR, Estimated 50 (*)    All other components within normal limits  LACTIC ACID, PLASMA  LACTIC ACID, PLASMA    EKG EKG Interpretation  Date/Time:  Saturday Feb 21 2023 10:04:44 EDT Ventricular Rate:  76 PR Interval:  164 QRS Duration: 104 QT Interval:  410 QTC Calculation: 461 R Axis:   -54 Text Interpretation: Sinus rhythm Prominent P waves, nondiagnostic LAD, consider left anterior fascicular block when compared to prior, overall similar appearance. No STEMI Confirmed by Theda Belfast ([id number]) on 02/21/2023 10:10:20 AM  Radiology CT Soft Tissue Neck W Contrast  Result Date: 02/21/2023 CLINICAL DATA:  62 year old female with throat closing and swelling. Difficulty breathing. Started new medication this week. EXAM: CT NECK WITH CONTRAST TECHNIQUE: Multidetector CT imaging of the neck was performed using the standard protocol following the bolus administration of intravenous contrast. RADIATION DOSE REDUCTION: This exam was performed according to the departmental dose-optimization program which includes automated exposure control, adjustment of the mA and/or kV according to patient size and/or use of iterative reconstruction technique. CONTRAST:  75mL OMNIPAQUE IOHEXOL 300 MG/ML  SOLN COMPARISON:  Cervical spine MRI 11/01/2015.  CTA chest 11/24/2017. FINDINGS: Pharynx and larynx: Bulky appearance of the soft palate on sagittal image 58, which appears asymmetrically enlarged greater on the left. The uvula is indistinct. Mild superimposed larynx and pharynx motion artifact. Epiglottis remains normal. Larynx is within normal limits. Gas distended hypopharynx. Mild lingual tonsil hypertrophy which could be physiologic. Palatine tonsils are probably surgically absent. There is asymmetric mild inflammation in the left  parapharyngeal space, tracking inferiorly from just above the soft palate on series 2, image 35. Right parapharyngeal and retropharyngeal spaces appear negative. Salivary glands: Negative.  Negative sublingual space. Thyroid: Negative. Lymph nodes: Left level 2 lymph nodes are asymmetrically enlarged up to 12 mm short axis (versus 7 mm on the right. No cystic or necrotic nodes. Left level 3 nodes similarly asymmetrically enlarged. Other bilateral lymph node stations are within normal limits. Vascular: Major vascular structures in the neck and at the skull base are enhancing and appear to be patent. No significant atherosclerosis in the neck. Limited intracranial: Negative; partially empty sella. Visualized orbits: Negative. Mastoids and visualized paranasal sinuses: Clear. Skeleton: No acute dental finding. No acute osseous abnormality identified. Mild for age cervical spine degeneration. Upper chest: Subglottic trachea is within normal limits. Well aerated upper lungs with mild respiratory motion. No superior mediastinal inflammation or lymphadenopathy. Negative visible aortic arch. IMPRESSION: 1. Bulky swelling of the soft palate, greater on the left and associated with mild parapharyngeal space inflammation on that side. Indistinct uvula. Reactive appearing Left level 2 and level 3 lymph nodes. This could be related to Infection Or Angioedema. 2. Epiglottis remains normal. No significant airway narrowing. And other pharyngeal soft tissues are within normal limits. Electronically Signed   By: Odessa Fleming M.D.   On: 02/21/2023 12:42    Procedures Procedures    CRITICAL CARE Performed by: Canary Brim Fareeda Downard Total critical care time: 20 minutes Critical care time was exclusive of separately billable procedures and treating other patients. Critical care was necessary to treat or prevent imminent or life-threatening deterioration. Critical care was time spent personally by me on the following activities:  development of treatment plan with patient and/or surrogate as well as nursing, discussions with consultants, evaluation of patient's response to treatment, examination of patient, obtaining history from patient or surrogate, ordering and performing treatments and interventions, ordering and review of laboratory studies, ordering and review of radiographic studies, pulse oximetry and re-evaluation of patient's condition.  Medications Ordered in ED Medications  diphenhydrAMINE (BENADRYL) injection 25 mg (25 mg Intravenous Given 02/21/23 1113)  EPINEPHrine (EPI-PEN) injection 0.3 mg (0.3 mg Intramuscular Given 02/21/23 1117)  methylPREDNISolone sodium succinate (SOLU-MEDROL) 125 mg/2 mL injection 125 mg (125 mg Intravenous Given 02/21/23 1114)  famotidine (PEPCID) IVPB 20 mg premix (0 mg Intravenous Stopped 02/21/23 1149)  iohexol (OMNIPAQUE) 300 MG/ML solution 100 mL (75 mLs Intravenous Contrast Given 02/21/23 1223)    ED Course/ Medical Decision Making/ A&P  Medical Decision Making Amount and/or Complexity of Data Reviewed Labs: ordered. Radiology: ordered.  Risk Prescription drug management.    Laura Campbell is a 62 y.o. female with a past medical history significant for diabetes, previous DVT/PE on Eliquis therapy, hypertension, and diverticulosis who presents with sore throat and throat closing sensation.  According to patient, she has had recent peripheral edema and had been started on a diuretic.  She reports that her edema significant proved and she has since stopped it.  She reports she was started on Januvia on Tuesday and then yesterday started having sore throat and feeling that her throat was getting tight.  She says that she saw her primary doctor yesterday who told her to worsen she is to come to the emergency department.  She reports it feels like her throat is closing and she is not tolerating her saliva well.  She reports she is feeling like 7  difficulty breathing at times with it given the tightness.  She is having pain and sore throat but denies fevers or chills.  She denies nausea or vomiting.  She denies any chest pain or true shortness of breath but does feels like the throat is causing her difficulty breathing at times.  She reports no abdominal pain, chest pain, or back pain.  No leg pain or leg swelling as this swelling has improved.  Otherwise denies complaints.  On exam, lungs clear.  No wheezing.  No stridor patient does have tenderness to her neck.  She also was difficult to assess her posterior oropharynx due to what appeared to be edema in her posterior oropharynx.  Uvula was difficult to see but appears to be midline.  Back of the neck nontender.  Chest and abdomen nontender.  Patient otherwise resting with some tachypnea.  She is not hypoxic.  Had a shared decision-making conversation with patient.  Clinically I do feel need to treat her as if this could be allergic reaction to the Januvia so we will give her steroids, antihistamines, and epinephrine.  One of the side effects was to be can be sore throat however with it feeling like it is swelling and closing we will treat aggressively.  We will however also get a strep swab and labs and CT imaging to rule out abscess or other infection.  She does not appear to need oxygen at this time we will continue to monitor.  Anticipate reassessment after workup to determine disposition.  Workup again returned.  She does have strep pharyngitis.  CT scan did not show abscess but showed lymph nodes and inflammation.  No evidence of airway narrowing.  Your testing otherwise consistent with mild leukocytosis but no lactic acidosis.  After the steroids patient is feeling much better from a sore throat standpoint.  As we have low suspicion for allergic reaction now, do not feel she needs multiple hours of monitoring for the epinephrine.  She was here for approximately 5 hours.  Patient will get  prescription for antibiotics, pain medicine, nausea medicine, and to proceed for EpiPen and will follow-up with her primary doctor.  She is able to tolerate p.o. and feeling much better.  Patient discharged in good condition for outpatient treatment of strep pharyngitis.        Final Clinical Impression(s) / ED Diagnoses Final diagnoses:  Strep pharyngitis  Sore throat    Rx / DC Orders ED Discharge Orders          Ordered    EPINEPHrine 0.3 mg/0.3 mL  IJ SOAJ injection  As needed        02/21/23 1501    ondansetron (ZOFRAN-ODT) 4 MG disintegrating tablet  Every 8 hours PRN        02/21/23 1501    HYDROcodone-acetaminophen (NORCO/VICODIN) 5-325 MG tablet  Every 4 hours PRN        02/21/23 1501    penicillin v potassium (VEETID) 500 MG tablet  2 times daily        02/21/23 1501            Clinical Impression: 1. Strep pharyngitis   2. Sore throat     Disposition: Discharge  Condition: Good  I have discussed the results, Dx and Tx plan with the pt(& family if present). He/she/they expressed understanding and agree(s) with the plan. Discharge instructions discussed at great length. Strict return precautions discussed and pt &/or family have verbalized understanding of the instructions. No further questions at time of discharge.    Discharge Medication List as of 02/21/2023  3:02 PM     START taking these medications   Details  EPINEPHrine 0.3 mg/0.3 mL IJ SOAJ injection Inject 0.3 mg into the muscle as needed for anaphylaxis., Starting Sat 02/21/2023, Normal    HYDROcodone-acetaminophen (NORCO/VICODIN) 5-325 MG tablet Take 1 tablet by mouth every 4 (four) hours as needed., Starting Sat 02/21/2023, Normal    ondansetron (ZOFRAN-ODT) 4 MG disintegrating tablet Take 1 tablet (4 mg total) by mouth every 8 (eight) hours as needed for nausea or vomiting., Starting Sat 02/21/2023, Normal    penicillin v potassium (VEETID) 500 MG tablet Take 1 tablet (500 mg total) by mouth  in the morning and at bedtime for 10 days., Starting Sat 02/21/2023, Until Tue 03/03/2023, Normal        Follow Up: Loyola Mast, MD [address] [place] [address] [phone]     [facility] Emergency Department at [facility] [address] [place] [address] [phone]        Mynor Witkop, Canary Brim, MD 02/21/23 [phone]

## 2023-02-21 NOTE — ED Triage Notes (Signed)
Pt started [place] beginning of this week, she had followup yesterday with MD, her throat was hurting ,MD didn't see any inflammation. Today throat is worse, feels like she can't swallow her on saliva and more short of breath. No fevers.

## 2023-02-23 ENCOUNTER — Telehealth: Payer: Self-pay | Admitting: Family Medicine

## 2023-02-23 MED ORDER — ACCU-CHEK GUIDE VI STRP: 1.0000 | ORAL_STRIP | 2 refills | Status: DC | PRN

## 2023-02-23 NOTE — Telephone Encounter (Signed)
RX sent to the pharmacy for Accu-chek test strips.  Dm/cma

## 2023-02-23 NOTE — Telephone Encounter (Signed)
Pt said she picked up Diabetic meter AccuCheck Guide and lancets but they need RX for the test strips.   [id number] [place], [place] - [address] AT [facility] OF [address] Phone: [phone]  Fax: [phone]

## 2023-02-24 ENCOUNTER — Telehealth: Payer: Self-pay

## 2023-02-24 NOTE — Transitions of Care (Post Inpatient/ED Visit) (Signed)
02/24/2023  Name: Laura Campbell MRN: [id number] DOB: 04-24-1961  Today's TOC FU Call Status: Today's TOC FU Call Status:: Successful TOC FU Call Competed TOC FU Call Complete Date: 02/24/23  Transition Care Management Follow-up Telephone Call Date of Discharge: 02/21/23 Discharge Facility: [facility] ([facility]) Type of Discharge: Emergency Department Reason for ED Visit: Other: (pharyngitis) How have you been since you were released from the hospital?: Better Any questions or concerns?: No  Items Reviewed: Did you receive and understand the discharge instructions provided?: Yes Medications obtained,verified, and reconciled?: Yes (Medications Reviewed) Any new allergies since your discharge?: Yes (Januvia - Throat swelling) Dietary orders reviewed?: NA Do you have support at home?: Yes  Medications Reviewed Today: Medications Reviewed Today     Reviewed by Larey Dresser, RN (Registered Nurse) on 02/24/23 at 0920  Med List Status: <None>   Medication Order Taking? Sig Documenting Provider Last Dose Status Informant  Accu-Chek Softclix Lancets lancets [id number]  1 each 3 (three) times daily. [provider]  Active   amLODipine (NORVASC) 10 MG tablet [id number]  Take 10 mg by mouth daily. [provider]  Active   apixaban (ELIQUIS) 2.5 MG TABS tablet [id number]  Take 1 tablet (2.5 mg total) by mouth 2 (two) times daily. Loyola Mast, MD  Active   Blood Glucose Monitoring Suppl (ONE Binnie Kand MINI) W/DEVICE KIT [id number]   [provider]  Active            Med Note Brooke Dare, EMMA L   Thu Mar 22, 2015  1:23 PM) Received from: External Pharmacy  Blood Glucose Monitoring Suppl DEVI [id number]  1 each by Does not apply route every morning. May substitute to any manufacturer covered by patient's insurance. Loyola Mast, MD  Active   EPINEPHrine 0.3 mg/0.3 mL IJ SOAJ injection [id number]  Inject 0.3 mg into the muscle as needed for anaphylaxis.  Tegeler, Canary Brim, MD  Active   glucose blood (ACCU-CHEK GUIDE) test strip [id number]  1 each by Other route as needed for other. Use as instructed Loyola Mast, MD  Active   Glucose Blood (BLOOD GLUCOSE TEST STRIPS) STRP [id number]  1 each by In Vitro route every morning. May substitute to any manufacturer covered by patient's insurance. Loyola Mast, MD  Active   hydrochlorothiazide (HYDRODIURIL) 25 MG tablet [id number]  Take 1 tablet (25 mg total) by mouth daily. Loyola Mast, MD  Active   HYDROcodone-acetaminophen (NORCO/VICODIN) 5-325 MG tablet [id number]  Take 1 tablet by mouth every 4 (four) hours as needed. Tegeler, Canary Brim, MD  Active   Lancet Device MISC [id number]  1 each by Does not apply route in the morning, at noon, and at bedtime. May substitute to any manufacturer covered by patient's insurance. Loyola Mast, MD  Active   Lancets Misc. MISC [id number]  1 each by Does not apply route in the morning, at noon, and at bedtime. May substitute to any manufacturer covered by patient's insurance. Loyola Mast, MD  Active   meloxicam [facility]) 15 MG tablet [id number]  Take 15 mg by mouth daily. [provider]  Active   ondansetron (ZOFRAN-ODT) 4 MG disintegrating tablet [id number]  Take 1 tablet (4 mg total) by mouth every 8 (eight) hours as needed for nausea or vomiting. Tegeler, Canary Brim, MD  Active   penicillin v potassium (VEETID) 500 MG tablet [id number]  Take 1 tablet (500 mg total) by mouth in the morning and at bedtime for 10  days. Tegeler, Canary Brim, MD  Active   rosuvastatin (CRESTOR) 5 MG tablet [id number]  Take 1 tablet (5 mg total) by mouth daily. Loyola Mast, MD  Active   sitaGLIPtin (JANUVIA) 100 MG tablet [id number] Take 1 tablet (100 mg total) by mouth daily.  Patient not taking: Reported on 02/24/2023   Loyola Mast, MD Not Taking Active   spironolactone (ALDACTONE) 25 MG tablet [id number]  Take 1 tablet (25 mg total) by mouth every  morning. Loyola Mast, MD  Active   telmisartan (MICARDIS) 80 MG tablet [id number]  Take 1 tablet (80 mg total) by mouth daily. Loyola Mast, MD  Active             Home Care and Equipment/Supplies: Were Home Health Services Ordered?: NA Any new equipment or medical supplies ordered?: NA  Functional Questionnaire: Do you need assistance with bathing/showering or dressing?: No Do you need assistance with meal preparation?: No Do you need assistance with eating?: No Do you have difficulty maintaining continence: No Do you need assistance with getting out of bed/getting out of a chair/moving?: No Do you have difficulty managing or taking your medications?: No  Follow up appointments reviewed: PCP Follow-up appointment confirmed?: No (Pt deferred appointment) Specialist Hospital Follow-up appointment confirmed?: NA Do you need transportation to your follow-up appointment?: No Do you understand care options if your condition(s) worsen?: Yes-patient verbalized understanding    SIGNATURE Arvil Persons, BSN, RN

## 2023-02-26 ENCOUNTER — Telehealth: Payer: Self-pay | Admitting: Family Medicine

## 2023-02-26 ENCOUNTER — Telehealth: Payer: Self-pay

## 2023-02-26 ENCOUNTER — Other Ambulatory Visit: Payer: Self-pay | Admitting: Family Medicine

## 2023-02-26 NOTE — Telephone Encounter (Signed)
Spoke to patient, she was given a Accu-chek meter ($15) and the test strips wer going to be ($56)(didn't pick them up.  She was very upset and so I called the pharmacy and spoke to pharmacist, they will switch out her meter to the One Touch due to that is the one insurance will cover and they will get her the test strips.    Called patient to advise and she will go back to pharmacy to change meters.  No further questions. Dm/cma

## 2023-02-27 NOTE — Telephone Encounter (Signed)
error 

## 2023-03-26 ENCOUNTER — Other Ambulatory Visit: Payer: Self-pay | Admitting: Family Medicine

## 2023-03-26 DIAGNOSIS — I10 Essential (primary) hypertension: Secondary | ICD-10-CM

## 2023-04-03 DIAGNOSIS — M542 Cervicalgia: Secondary | ICD-10-CM | POA: Diagnosis not present

## 2023-04-03 DIAGNOSIS — M25511 Pain in right shoulder: Secondary | ICD-10-CM | POA: Diagnosis not present

## 2023-04-07 ENCOUNTER — Ambulatory Visit (INDEPENDENT_AMBULATORY_CARE_PROVIDER_SITE_OTHER): Payer: Medicare Other | Admitting: Family Medicine

## 2023-04-07 VITALS — BP 134/80 | HR 56 | Temp 98.5°F | Ht 62.0 in | Wt 227.2 lb

## 2023-04-07 DIAGNOSIS — E119 Type 2 diabetes mellitus without complications: Secondary | ICD-10-CM

## 2023-04-07 DIAGNOSIS — E785 Hyperlipidemia, unspecified: Secondary | ICD-10-CM

## 2023-04-07 DIAGNOSIS — I10 Essential (primary) hypertension: Secondary | ICD-10-CM | POA: Diagnosis not present

## 2023-04-07 LAB — BASIC METABOLIC PANEL
BUN: 25 mg/dL — ABNORMAL HIGH (ref 6–23)
CO2: 24 mEq/L (ref 19–32)
Calcium: 10.6 mg/dL — ABNORMAL HIGH (ref 8.4–10.5)
Chloride: 105 mEq/L (ref 96–112)
Creatinine, Ser: 1.09 mg/dL (ref 0.40–1.20)
GFR: 53.77 mL/min — ABNORMAL LOW (ref >60.00)
Glucose, Bld: 123 mg/dL — ABNORMAL HIGH (ref 70–99)
Potassium: 5.4 mEq/L — ABNORMAL HIGH (ref 3.5–5.1)
Sodium: 135 mEq/L (ref 135–145)

## 2023-04-07 LAB — HEMOGLOBIN A1C: Hgb A1c MFr Bld: 7.9 % — ABNORMAL HIGH (ref 4.6–6.5)

## 2023-04-07 NOTE — Assessment & Plan Note (Signed)
Stable.  Continue rosuvastatin 5 mg daily. 

## 2023-04-07 NOTE — Progress Notes (Signed)
[facility] [address] [place] [address] Dept: [phone] Dept Fax: [phone]  Chronic Care Office Visit  Subjective:    Patient ID: Laura Campbell, female    DOB: Jul 10, 1961, 62 y.o..   MRN: [id number]  Chief Complaint  Patient presents with   Medical Management of Chronic Issues    3 month f/u. No concerns.  Fasting today.    History of Present Illness:  Patient is in today for reassessment of chronic medical issues.  Laura Campbell has a history of Type 2 diabetes. In late April, I prescribed sitagliptin (Januvia). I saw her on 5/17 in follow-up, at which point she mentioned some mild sore throat being present. The following day, she was seen at the [facility] ED. She complained of her throat feeling like it was closing up. She was treated for a possible allergic reaction to the sitagliptin. However, at the same visit she tested positive for Group A strep.  She was treated with an antibiotic. Laura Campbell believes her throat issues were an allergic reaction. She had prior GI intolerance with metformin and developed lower leg edema while on pioglitazone. She states she is "tired of being a [place] pig" and declines to take any other new medicine for her diabetes.    Laura Campbell has a history of hypertension that has been a bit labile. She is currently on HCTZ 25 mg daily, spironolactone 25 mg daily, and telmisartan 80 mg daily. She had swelling previously attributed to amlodipine use, which was stopped.   Laura Campbell has a history of hyperlipidemia and is managed on rosuvastatin 5 mg daily.  Past Medical History: Patient Active Problem List   Diagnosis Date Noted   Viral pharyngitis 02/20/2023   Morbid obesity with BMI of 40.0-44.9, adult (HCC) 04/15/2022   Protein C deficiency (HCC) 04/15/2022   Protein S deficiency (HCC) 04/15/2022   History of colon polyps 12/11/2021   Diverticulosis 12/11/2021   Lumbar  radiculopathy, chronic 05/07/2021   Spinal stenosis, lumbar region without neurogenic claudication 05/07/2021   Essential hypertension 05/07/2021   History of DVT (deep vein thrombosis) 05/07/2021   History of pulmonary embolus (PE) 05/07/2021   Hyperlipidemia 05/07/2021   Chronic low back pain 05/07/2021   Hereditary and idiopathic peripheral neuropathy 03/27/2015   Left foot pain 03/27/2015   Type 2 diabetes mellitus (HCC) 03/27/2015   Past Surgical History:  Procedure Laterality Date   ABDOMINAL HYSTERECTOMY  10/06/2008   TONSILLECTOMY     Family History  Problem Relation Age of Onset   Heart disease Mother    Cirrhosis Father    Stroke Sister    Diabetes Sister    Cancer Sister        Breast   Breast cancer Maternal Aunt    Breast cancer Cousin    Neuropathy Neg Hx    Colon cancer Neg Hx    Esophageal cancer Neg Hx    Liver cancer Neg Hx    Pancreatic cancer Neg Hx    Outpatient Medications Prior to Visit  Medication Sig Dispense Refill   apixaban (ELIQUIS) 2.5 MG TABS tablet Take 1 tablet (2.5 mg total) by mouth 2 (two) times daily. 180 tablet 2   Blood Glucose Monitoring Suppl (ONE TOUCH ULTRA MINI) W/DEVICE KIT      Blood Glucose Monitoring Suppl DEVI 1 each by Does not apply route every morning. May substitute to any manufacturer covered by patient's insurance. 1 each 0   EPINEPHrine  0.3 mg/0.3 mL IJ SOAJ injection Inject 0.3 mg into the muscle as needed for anaphylaxis. 2 each 0   Glucose Blood (BLOOD GLUCOSE TEST STRIPS) STRP 1 each by In Vitro route every morning. May substitute to any manufacturer covered by patient's insurance. 100 strip 3   glucose blood test strip 1 each by Other route as needed for other. Use as instructed     hydrochlorothiazide (HYDRODIURIL) 25 MG tablet Take 1 tablet (25 mg total) by mouth daily. 90 tablet 2   meloxicam (MOBIC) 15 MG tablet Take 15 mg by mouth daily.     ondansetron (ZOFRAN-ODT) 4 MG disintegrating tablet Take 1 tablet (4  mg total) by mouth every 8 (eight) hours as needed for nausea or vomiting. 20 tablet 0   rosuvastatin (CRESTOR) 5 MG tablet Take 1 tablet (5 mg total) by mouth daily. 90 tablet 2   spironolactone (ALDACTONE) 25 MG tablet TAKE ONE TABLET (25MG  TOTAL) BY MOUTH EVERY MORNING 90 tablet 3   telmisartan (MICARDIS) 80 MG tablet Take 1 tablet (80 mg total) by mouth daily. 90 tablet 2   glucose blood test strip 1 each by Other route as needed for other. Use as instructed     sitaGLIPtin (JANUVIA) 100 MG tablet Take 1 tablet (100 mg total) by mouth daily. (Patient not taking: Reported on 02/24/2023) 30 tablet 11   amLODipine (NORVASC) 10 MG tablet Take 10 mg by mouth daily. (Patient not taking: Reported on 04/07/2023)     HYDROcodone-acetaminophen (NORCO/VICODIN) 5-325 MG tablet Take 1 tablet by mouth every 4 (four) hours as needed. 10 tablet 0   No facility-administered medications prior to visit.   Allergies  Allergen Reactions   Januvia [Sitagliptin] Anaphylaxis    Pt reported being treated at the ED after taking Januvia and experiencing swelling of the throat.   Jardiance [Empagliflozin] Other (See Comments)    Hypotension   Metformin And Related Other (See Comments)    GI Intolerance   Pioglitazone Swelling   Objective:   Today's Vitals   04/07/23 1019  BP: 134/80  Pulse: (!) 56  Temp: 98.5 F (36.9 C)  TempSrc: Temporal  SpO2: 97%  Weight: 227 lb 3.2 oz (103.1 kg)  Height: 5\' 2"  (1.575 m)   Body mass index is 41.56 kg/m.   General: Well developed, well nourished. No acute distress. Extremities: No edema noted. Psych: Alert and oriented. Normal mood and affect.  There are no preventive care reminders to display for this patient.    Assessment & Plan:   Problem List Items Addressed This Visit       Cardiovascular and Mediastinum   Essential hypertension - Primary    Laura Campbell's BP is mildly high today. I recommend she continue HCTZ 25 mg daily, spironolactone 25 mg daily,  and telmisartan 80 mg daily. As her last several BPs were normal or low, I recommend we follow this for now. She does complain of some cramping, so I will recheck her electrolytes today.        Endocrine   Type 2 diabetes mellitus (HCC)    Laura Campbell's A1c has been above goal. In review of the recent ED visit, I question whether she truly had an allergic reaction to sitagliptin, as she had a positive strep test at the same time, which could have accounted for the throat symptoms. However, she is adamant about not taking further new diabetes medicines. I expressed my concern for potential harms from not treating her diabetes. I  offered to refer her to endocrinology to discuss other options for her diabetes management.      Relevant Orders   Basic metabolic panel   Hemoglobin A1c   Ambulatory referral to Endocrinology     Other   Hyperlipidemia    Stable. Continue rosuvastatin 5 mg daily.       Return in about 3 months (around 07/08/2023) for Reassessment.   Loyola Mast, MD

## 2023-04-07 NOTE — Assessment & Plan Note (Signed)
Laura Campbell's BP is mildly high today. I recommend she continue HCTZ 25 mg daily, spironolactone 25 mg daily, and telmisartan 80 mg daily. As her last several BPs were normal or low, I recommend we follow this for now. She does complain of some cramping, so I will recheck her electrolytes today.

## 2023-04-07 NOTE — Assessment & Plan Note (Signed)
Ms. Dyes A1c has been above goal. In review of the recent ED visit, I question whether she truly had an allergic reaction to sitagliptin, as she had a positive strep test at the same time, which could have accounted for the throat symptoms. However, she is adamant about not taking further new diabetes medicines. I expressed my concern for potential harms from not treating her diabetes. I offered to refer her to endocrinology to discuss other options for her diabetes management.

## 2023-04-16 ENCOUNTER — Other Ambulatory Visit: Payer: Self-pay | Admitting: Family Medicine

## 2023-04-16 DIAGNOSIS — D6859 Other primary thrombophilia: Secondary | ICD-10-CM

## 2023-04-16 DIAGNOSIS — Z86718 Personal history of other venous thrombosis and embolism: Secondary | ICD-10-CM

## 2023-04-16 DIAGNOSIS — Z86711 Personal history of pulmonary embolism: Secondary | ICD-10-CM

## 2023-04-22 DIAGNOSIS — M25511 Pain in right shoulder: Secondary | ICD-10-CM | POA: Diagnosis not present

## 2023-04-22 DIAGNOSIS — M5412 Radiculopathy, cervical region: Secondary | ICD-10-CM | POA: Diagnosis not present

## 2023-04-24 DIAGNOSIS — M25511 Pain in right shoulder: Secondary | ICD-10-CM | POA: Diagnosis not present

## 2023-05-04 ENCOUNTER — Other Ambulatory Visit: Payer: Self-pay | Admitting: Family Medicine

## 2023-05-04 DIAGNOSIS — E785 Hyperlipidemia, unspecified: Secondary | ICD-10-CM

## 2023-05-07 DIAGNOSIS — E119 Type 2 diabetes mellitus without complications: Secondary | ICD-10-CM | POA: Diagnosis not present

## 2023-05-14 DIAGNOSIS — M25511 Pain in right shoulder: Secondary | ICD-10-CM | POA: Diagnosis not present

## 2023-05-14 DIAGNOSIS — M75101 Unspecified rotator cuff tear or rupture of right shoulder, not specified as traumatic: Secondary | ICD-10-CM | POA: Diagnosis not present

## 2023-05-15 DIAGNOSIS — K080 Exfoliation of teeth due to systemic causes: Secondary | ICD-10-CM | POA: Diagnosis not present

## 2023-06-01 ENCOUNTER — Other Ambulatory Visit: Payer: Self-pay | Admitting: Family Medicine

## 2023-06-01 DIAGNOSIS — I10 Essential (primary) hypertension: Secondary | ICD-10-CM

## 2023-06-07 DIAGNOSIS — E119 Type 2 diabetes mellitus without complications: Secondary | ICD-10-CM | POA: Diagnosis not present

## 2023-07-01 ENCOUNTER — Other Ambulatory Visit: Payer: Self-pay | Admitting: Family Medicine

## 2023-07-01 DIAGNOSIS — I10 Essential (primary) hypertension: Secondary | ICD-10-CM

## 2023-07-07 DIAGNOSIS — E119 Type 2 diabetes mellitus without complications: Secondary | ICD-10-CM | POA: Diagnosis not present

## 2023-07-08 ENCOUNTER — Ambulatory Visit (INDEPENDENT_AMBULATORY_CARE_PROVIDER_SITE_OTHER): Payer: Medicare Other | Admitting: Family Medicine

## 2023-07-08 VITALS — BP 128/80 | HR 60 | Temp 98.1°F | Ht 62.0 in | Wt 231.4 lb

## 2023-07-08 DIAGNOSIS — E785 Hyperlipidemia, unspecified: Secondary | ICD-10-CM

## 2023-07-08 DIAGNOSIS — E119 Type 2 diabetes mellitus without complications: Secondary | ICD-10-CM

## 2023-07-08 DIAGNOSIS — N1831 Chronic kidney disease, stage 3a: Secondary | ICD-10-CM

## 2023-07-08 DIAGNOSIS — I10 Essential (primary) hypertension: Secondary | ICD-10-CM | POA: Diagnosis not present

## 2023-07-08 DIAGNOSIS — Z23 Encounter for immunization: Secondary | ICD-10-CM | POA: Diagnosis not present

## 2023-07-08 LAB — BASIC METABOLIC PANEL
BUN: 27 mg/dL — ABNORMAL HIGH (ref 6–23)
CO2: 26 meq/L (ref 19–32)
Calcium: 10.3 mg/dL (ref 8.4–10.5)
Chloride: 104 meq/L (ref 96–112)
Creatinine, Ser: 1.11 mg/dL (ref 0.40–1.20)
GFR: 52.51 mL/min — ABNORMAL LOW (ref >60.00)
Glucose, Bld: 127 mg/dL — ABNORMAL HIGH (ref 70–99)
Potassium: 4.7 meq/L (ref 3.5–5.1)
Sodium: 138 meq/L (ref 135–145)

## 2023-07-08 LAB — MICROALBUMIN / CREATININE URINE RATIO
Creatinine,U: 78.3 mg/dL
Microalb Creat Ratio: 0.9 mg/g (ref 0.0–30.0)
Microalb, Ur: <0.7 mg/dL (ref 0.0–1.9)

## 2023-07-08 LAB — HEMOGLOBIN A1C: Hgb A1c MFr Bld: 8.7 % — ABNORMAL HIGH (ref 4.6–6.5)

## 2023-07-08 LAB — PHOSPHORUS: Phosphorus: 3.2 mg/dL (ref 2.3–4.6)

## 2023-07-08 MED ORDER — TELMISARTAN 80 MG PO TABS: 80.0000 mg | ORAL_TABLET | Freq: Every day | ORAL | 2 refills | Status: DC

## 2023-07-08 NOTE — Assessment & Plan Note (Signed)
Laura Campbell eGFR has been consistently in the Stage 3a range for CKD. I will repeat her micral today. I will screen for potential complications of CKD. Continue focus on blood pressure and glucose control, adequate hydration, and avoidance of nephrotoxic medications.

## 2023-07-08 NOTE — Assessment & Plan Note (Signed)
Stable.  Continue rosuvastatin 5 mg daily. 

## 2023-07-08 NOTE — Assessment & Plan Note (Signed)
Ms. Meline's BP is acceptable today. Continue HCTZ 25 mg daily, spironolactone 25 mg daily, and telmisartan 80 mg daily.

## 2023-07-08 NOTE — Assessment & Plan Note (Signed)
Laura Campbell A1c has been above goal. She refuses to take any diabetes medicines at present. I will check her A1c and annual micral today. I will renew her referral to endocrinology.

## 2023-07-08 NOTE — Progress Notes (Signed)
[facility] [address] [place] [address] Dept: [phone] Dept Fax: [phone]  Chronic Care Office Visit  Subjective:    Patient ID: Laura Campbell, female    DOB: [birth year], 62 y.o..   MRN: [id number]  Chief Complaint  Patient presents with   Hypertension    3 month f/u. No concerns.  Fasting today    History of Present Illness:  Patient is in today for reassessment of chronic medical issues.  Laura Campbell has a history of Type 2 diabetes. In late April, I prescribed sitagliptin (Januvia). I saw her on 5/17 in follow-up, at which point she mentioned some mild sore throat being present. The following day, she was seen at the [facility] ED. She complained of her throat feeling like it was closing up. She was treated for a possible allergic reaction to the sitagliptin. However, at the same visit she tested positive for Group A strep.  She was treated with an antibiotic. Laura Campbell believes her throat issues were an allergic reaction. She had prior GI intolerance with metformin and developed lower leg edema while on pioglitazone. She now declines to be on any medication for treating her diabetes. I had referred her to endocrinology,  but she notes there was difficulty connecting with them by phone (phone tag), so she ahs not been scheduled. She states her fasting blood sugars have been ~ 140.   Laura Campbell has a history of hypertension that has been a bit labile. She is currently on HCTZ 25 mg daily, spironolactone 25 mg daily, and telmisartan 80 mg daily. She had swelling previously attributed to amlodipine use, which was stopped.    Laura Campbell has a history of hyperlipidemia and is managed on rosuvastatin 5 mg daily.  Past Medical History: Patient Active Problem List   Diagnosis Date Noted   Chronic kidney disease, stage 3a (HCC) 07/08/2023   Morbid obesity with BMI of 40.0-44.9, adult (HCC) 04/15/2022    Protein C deficiency (HCC) 04/15/2022   Protein S deficiency (HCC) 04/15/2022   History of colon polyps 12/11/2021   Diverticulosis 12/11/2021   Lumbar radiculopathy, chronic 05/07/2021   Spinal stenosis, lumbar region without neurogenic claudication 05/07/2021   Essential hypertension 05/07/2021   History of DVT (deep vein thrombosis) 05/07/2021   History of pulmonary embolus (PE) 05/07/2021   Hyperlipidemia 05/07/2021   Chronic low back pain 05/07/2021   Hereditary and idiopathic peripheral neuropathy 03/27/2015   Left foot pain 03/27/2015   Type 2 diabetes mellitus (HCC) 03/27/2015   Past Surgical History:  Procedure Laterality Date   ABDOMINAL HYSTERECTOMY  10/06/2008   TONSILLECTOMY     Family History  Problem Relation Age of Onset   Heart disease Mother    Cirrhosis Father    Stroke Sister    Diabetes Sister    Cancer Sister        Breast   Breast cancer Maternal Aunt    Breast cancer Cousin    Neuropathy Neg Hx    Colon cancer Neg Hx    Esophageal cancer Neg Hx    Liver cancer Neg Hx    Pancreatic cancer Neg Hx    Outpatient Medications Prior to Visit  Medication Sig Dispense Refill   Blood Glucose Monitoring Suppl (ONE TOUCH ULTRA MINI) W/DEVICE KIT      Blood Glucose Monitoring Suppl DEVI 1 each by Does not apply route every morning. May substitute to any manufacturer covered by patient's insurance. 1 each  0   ELIQUIS 2.5 MG TABS tablet TAKE ONE TABLET BY MOUTH TWICE DAILY @ 9AM-5PM 60 tablet 11   EPINEPHrine 0.3 mg/0.3 mL IJ SOAJ injection Inject 0.3 mg into the muscle as needed for anaphylaxis. 2 each 0   Glucose Blood (BLOOD GLUCOSE TEST STRIPS) STRP 1 each by In Vitro route every morning. May substitute to any manufacturer covered by patient's insurance. 100 strip 3   glucose blood test strip 1 each by Other route as needed for other. Use as instructed     glucose blood test strip 1 each by Other route as needed for other. Use as instructed      hydrochlorothiazide (HYDRODIURIL) 25 MG tablet TAKE ONE TABLET BY MOUTH DAILY AT 9AM 90 tablet 11   meloxicam (MOBIC) 15 MG tablet Take 15 mg by mouth daily.     ondansetron (ZOFRAN-ODT) 4 MG disintegrating tablet Take 1 tablet (4 mg total) by mouth every 8 (eight) hours as needed for nausea or vomiting. 20 tablet 0   rosuvastatin (CRESTOR) 5 MG tablet TAKE ONE TABLET (5 MG TOTAL) BY MOUTH DAILY AT 5PM 90 tablet 3   spironolactone (ALDACTONE) 25 MG tablet TAKE ONE TABLET (25MG  TOTAL) BY MOUTH EVERY MORNING 90 tablet 3   telmisartan (MICARDIS) 80 MG tablet Take 1 tablet (80 mg total) by mouth daily. 90 tablet 2   No facility-administered medications prior to visit.   Allergies  Allergen Reactions   Januvia [Sitagliptin] Anaphylaxis    Pt reported being treated at the ED after taking Januvia and experiencing swelling of the throat.   Jardiance [Empagliflozin] Other (See Comments)    Hypotension   Metformin And Related Other (See Comments)    GI Intolerance   Pioglitazone Swelling   Objective:   Today's Vitals   07/08/23 1041  BP: 128/80  Pulse: 60  Temp: 98.1 F (36.7 C)  TempSrc: Temporal  SpO2: 100%  Weight: 231 lb 6.4 oz (105 kg)  Height: 5\' 2"  (1.575 m)   Body mass index is 42.32 kg/m.   General: Well developed, well nourished. No acute distress. Psych: Alert and oriented. Normal mood and affect.  Health Maintenance Due  Topic Date Due   INFLUENZA VACCINE  05/07/2023   Diabetic kidney evaluation - Urine ACR  07/17/2023   Medicare Annual Wellness (AWV)  07/23/2023   Lab Results    Latest Ref Rng & Units 04/07/2023   10:54 AM 02/21/2023   11:07 AM 02/20/2023   10:40 AM  CMP  Glucose 70 - 99 mg/dL 161  096  045   BUN 6 - 23 mg/dL 25  27  37   Creatinine 0.40 - 1.20 mg/dL 4.09  8.11  9.14   Sodium 135 - 145 mEq/L 135  139  143   Potassium 3.5 - 5.1 mEq/L 5.4  4.0  4.4   Chloride 96 - 112 mEq/L 105  106  109   CO2 19 - 32 mEq/L 24  25  24    Calcium 8.4 - 10.5 mg/dL  78.2  95.6  21.3   Total Protein 6.5 - 8.1 g/dL  7.6    Total Bilirubin 0.3 - 1.2 mg/dL  1.0    Alkaline Phos 38 - 126 U/L  58    AST 15 - 41 U/L  12    ALT 0 - 44 U/L  12     Last lipids Lab Results  Component Value Date   CHOL 176 12/20/2021   HDL 67 12/20/2021  LDLCALC 91 12/20/2021   TRIG 85 12/20/2021   CHOLHDL 2.6 12/20/2021   Last hemoglobin A1c Lab Results  Component Value Date   HGBA1C 7.9 (H) 04/07/2023     Assessment & Plan:   Problem List Items Addressed This Visit       Cardiovascular and Mediastinum   Essential hypertension    Ms. Piercefield's BP is acceptable today. Continue HCTZ 25 mg daily, spironolactone 25 mg daily, and telmisartan 80 mg daily.      Relevant Medications   telmisartan (MICARDIS) 80 MG tablet     Endocrine   Type 2 diabetes mellitus (HCC) - Primary    Ms. Stoltz's A1c has been above goal. She refuses to take any diabetes medicines at present. I will check her A1c and annual micral today. I will renew her referral to endocrinology.      Relevant Medications   telmisartan (MICARDIS) 80 MG tablet   Other Relevant Orders   Hemoglobin A1c   Basic metabolic panel   Ambulatory referral to Endocrinology   Microalbumin / creatinine urine ratio     Genitourinary   Chronic kidney disease, stage 3a (HCC)    Ms. Tursi's eGFR has been consistently in the Stage 3a range for CKD. I will repeat her micral today. I will screen for potential complications of CKD. Continue focus on blood pressure and glucose control, adequate hydration, and avoidance of nephrotoxic medications.       Relevant Orders   Phosphorus   Parathyroid hormone, intact (no Ca)   Basic metabolic panel   Microalbumin / creatinine urine ratio     Other   Hyperlipidemia    Stable. Continue rosuvastatin 5 mg daily.      Relevant Medications   telmisartan (MICARDIS) 80 MG tablet   Other Visit Diagnoses     Need for immunization against influenza       Relevant  Orders   Flu Vaccine Trivalent High Dose (Fluad) (Completed)       Return in about 3 months (around 10/08/2023) for Reassessment.   Loyola Mast, MD

## 2023-07-09 LAB — PARATHYROID HORMONE, INTACT (NO CA): PTH: 58 pg/mL (ref 16–77)

## 2023-07-10 ENCOUNTER — Telehealth: Payer: Self-pay | Admitting: Family Medicine

## 2023-07-10 NOTE — Telephone Encounter (Signed)
Prescription Request  07/10/2023  LOV: 07/08/2023  What is the name of the medication or equipment? Amlodipine 10mg   Have you contacted your pharmacy to request a refill? No   Which pharmacy would you like this sent to?   [facility] [place] [address] [place] [address] Phone: [phone] Fax: [phone]     Patient notified that their request is being sent to the clinical staff for review and that they should receive a response within 2 business days.   Please advise at Mobile [phone] (mobile)

## 2023-07-13 NOTE — Telephone Encounter (Signed)
Patient notified and stated that she wasn't needing that she was asking for the [facility].  Advised that it was sent into the [facility] on 07/08/23.  No further questions.  Dm/cma

## 2023-07-16 ENCOUNTER — Telehealth: Payer: Self-pay | Admitting: Family Medicine

## 2023-07-16 NOTE — Telephone Encounter (Signed)
Prescription Request  07/16/2023  LOV: 07/08/2023  What is the name of the medication or equipment? amLODipine (NORVASC) 10 MG tablet [[id number]]  DISCONTINUED   Have you contacted your pharmacy to request a refill? No   Which pharmacy would you like this sent to?   [facility] [place] [address] [place] [address] Phone: [phone] Fax: [phone]   Patient notified that their request is being sent to the clinical staff for review and that they should receive a response within 2 business days.   Please advise at Mobile [phone] (mobile)

## 2023-07-17 NOTE — Telephone Encounter (Signed)
This was already discussed in a previous note.   Will call her again. Dm/cma

## 2023-07-17 NOTE — Telephone Encounter (Signed)
Called patient, she states that she didn't call in about any medication refills.  Dm/cma

## 2023-07-21 NOTE — Telephone Encounter (Signed)
[facility] Refill request  amLODipine (NORVASC) 10 MG tablet [[id number]]  DISCONTINUED    [facility] [place] [address] [place] [address] Phone: [phone] Fax: [phone] They said they would fax a form

## 2023-07-24 DIAGNOSIS — M5416 Radiculopathy, lumbar region: Secondary | ICD-10-CM | POA: Diagnosis not present

## 2023-07-27 ENCOUNTER — Ambulatory Visit (INDEPENDENT_AMBULATORY_CARE_PROVIDER_SITE_OTHER): Payer: Medicare Other

## 2023-07-27 DIAGNOSIS — Z0000 Encounter for general adult medical examination without abnormal findings: Secondary | ICD-10-CM

## 2023-07-27 NOTE — Patient Instructions (Signed)
Laura Campbell , Thank you for taking time to come for your Medicare Wellness Visit. I appreciate your ongoing commitment to your health goals. Please review the following plan we discussed and let me know if I can assist you in the future.   Referrals/Orders/Follow-Ups/Clinician Recommendations: none  This is a list of the screening recommended for you and due dates:  Health Maintenance  Topic Date Due   Complete foot exam   07/17/2023   Eye exam for diabetics  08/10/2023   Mammogram  12/30/2023   Hemoglobin A1C  01/06/2024   Cologuard (Stool DNA test)  05/26/2024   Yearly kidney function blood test for diabetes  07/07/2024   Yearly kidney health urinalysis for diabetes  07/07/2024   Medicare Annual Wellness Visit  07/26/2024   DTaP/Tdap/Td vaccine (2 - Td or Tdap) 05/08/2031   Flu Shot  Completed   Hepatitis C Screening  Completed   HIV Screening  Completed   HPV Vaccine  Aged Out   Colon Cancer Screening  Discontinued   COVID-19 Vaccine  Discontinued   Zoster (Shingles) Vaccine  Discontinued    Advanced directives: (ACP Link)Information on Advanced Care Planning can be found at [facility] [place] Advance Health Care Directives Advance Health Care Directives ([email or web address]   Next Medicare Annual Wellness Visit scheduled for next year: Yes  insert Preventive Care Attachment Reference
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
## 2023-07-28 ENCOUNTER — Other Ambulatory Visit: Payer: Self-pay | Admitting: Family Medicine

## 2023-07-28 DIAGNOSIS — I10 Essential (primary) hypertension: Secondary | ICD-10-CM

## 2023-07-31 ENCOUNTER — Telehealth: Payer: Self-pay | Admitting: Family Medicine

## 2023-07-31 NOTE — Telephone Encounter (Signed)
Spoke to [employer] and advised that patient no loonger takes the Amlodipine.  Dm/cma

## 2023-07-31 NOTE — Telephone Encounter (Signed)
07/31/23 - Henrietta Hoover from [facility] called requesting for a med refill for there pt. Amlodipine 10 mg. Call back #[phone]

## 2023-08-07 DIAGNOSIS — E119 Type 2 diabetes mellitus without complications: Secondary | ICD-10-CM | POA: Diagnosis not present

## 2023-08-20 DIAGNOSIS — M5416 Radiculopathy, lumbar region: Secondary | ICD-10-CM | POA: Diagnosis not present

## 2023-08-21 ENCOUNTER — Other Ambulatory Visit: Payer: Self-pay | Admitting: Family Medicine

## 2023-08-21 DIAGNOSIS — I10 Essential (primary) hypertension: Secondary | ICD-10-CM

## 2023-09-02 DIAGNOSIS — M5416 Radiculopathy, lumbar region: Secondary | ICD-10-CM | POA: Diagnosis not present

## 2023-09-06 DIAGNOSIS — E119 Type 2 diabetes mellitus without complications: Secondary | ICD-10-CM | POA: Diagnosis not present

## 2023-09-25 DIAGNOSIS — M722 Plantar fascial fibromatosis: Secondary | ICD-10-CM | POA: Diagnosis not present

## 2023-10-07 DIAGNOSIS — E119 Type 2 diabetes mellitus without complications: Secondary | ICD-10-CM | POA: Diagnosis not present

## 2023-10-09 ENCOUNTER — Ambulatory Visit: Payer: Medicare Other | Admitting: Family Medicine

## 2023-10-14 ENCOUNTER — Telehealth: Payer: Self-pay | Admitting: Family Medicine

## 2023-10-14 ENCOUNTER — Encounter: Payer: Self-pay | Admitting: Endocrinology

## 2023-10-14 ENCOUNTER — Ambulatory Visit: Payer: Medicare Other | Admitting: Endocrinology

## 2023-10-14 VITALS — BP 130/78 | HR 67 | Resp 20 | Ht 62.0 in | Wt 235.8 lb

## 2023-10-14 DIAGNOSIS — Z794 Long term (current) use of insulin: Secondary | ICD-10-CM | POA: Diagnosis not present

## 2023-10-14 DIAGNOSIS — E119 Type 2 diabetes mellitus without complications: Secondary | ICD-10-CM | POA: Diagnosis not present

## 2023-10-14 LAB — POCT GLYCOSYLATED HEMOGLOBIN (HGB A1C): Hemoglobin A1C: 9.1 % — AB (ref 4.0–5.6)

## 2023-10-14 MED ORDER — INSULIN PEN NEEDLE 32G X 4 MM MISC: 3 refills | Status: DC

## 2023-10-14 MED ORDER — LANTUS SOLOSTAR 100 UNIT/ML ~~LOC~~ SOPN: PEN_INJECTOR | SUBCUTANEOUS | 11 refills | Status: DC

## 2023-10-14 NOTE — Patient Instructions (Signed)
 start Lantus 20 units daily in the morning, increase by 2 units every 5 days, with maximum 30 units / day until fasting glucose in low 100 s range.  Check glucose in the morning fasting and at bedtime. Bring meter in follow up visit.
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
## 2023-10-14 NOTE — Telephone Encounter (Signed)
 10/09/2023 same day cancel e2c2 "can't walk", pt was not sent to nurse triage, I called to follow up and pt states she had pulled a muscle and is doing fine now. She is rescheduled for 10/19/2023.   1st missed visit

## 2023-10-15 ENCOUNTER — Telehealth: Payer: Self-pay

## 2023-10-15 ENCOUNTER — Other Ambulatory Visit: Payer: Self-pay | Admitting: Endocrinology

## 2023-10-15 MED ORDER — TRESIBA FLEXTOUCH 100 UNIT/ML ~~LOC~~ SOPN: PEN_INJECTOR | SUBCUTANEOUS | 4 refills | Status: DC

## 2023-10-15 NOTE — Telephone Encounter (Signed)
 Patient stated insulin ordered was on back order at pharmacy, states she does not know what other pharmacy to go to, requesting a change in insulin if possible.

## 2023-10-15 NOTE — Telephone Encounter (Signed)
 Sent prescription for Evaristo Bury same dose.

## 2023-10-19 ENCOUNTER — Ambulatory Visit: Payer: Medicare Other | Admitting: Family Medicine

## 2023-10-23 DIAGNOSIS — M25571 Pain in right ankle and joints of right foot: Secondary | ICD-10-CM | POA: Diagnosis not present

## 2023-11-07 DIAGNOSIS — E119 Type 2 diabetes mellitus without complications: Secondary | ICD-10-CM | POA: Diagnosis not present

## 2023-11-09 ENCOUNTER — Ambulatory Visit: Payer: Medicare Other | Admitting: Family Medicine

## 2023-11-09 ENCOUNTER — Encounter: Payer: Self-pay | Admitting: Family Medicine

## 2023-11-09 VITALS — BP 122/70 | HR 56 | Temp 98.0°F | Ht 62.0 in | Wt 237.2 lb

## 2023-11-09 DIAGNOSIS — I10 Essential (primary) hypertension: Secondary | ICD-10-CM | POA: Diagnosis not present

## 2023-11-09 DIAGNOSIS — N1831 Chronic kidney disease, stage 3a: Secondary | ICD-10-CM

## 2023-11-09 DIAGNOSIS — E119 Type 2 diabetes mellitus without complications: Secondary | ICD-10-CM

## 2023-11-09 DIAGNOSIS — M25571 Pain in right ankle and joints of right foot: Secondary | ICD-10-CM

## 2023-11-09 DIAGNOSIS — Z794 Long term (current) use of insulin: Secondary | ICD-10-CM

## 2023-11-09 NOTE — Assessment & Plan Note (Signed)
 Continue focus on blood pressure and glucose control, adequate hydration, and avoidance of nephrotoxic medications.

## 2023-11-09 NOTE — Assessment & Plan Note (Signed)
Laura Campbell's BP is at goal. Continue HCTZ 25 mg daily, spironolactone 25 mg daily, and telmisartan 80 mg daily.

## 2023-11-09 NOTE — Assessment & Plan Note (Signed)
Laura Campbell A1c has been above goal. She is now on Lantus under the care of Dr. Erroll Luna. Her fasting blood sugar sounds improved. We will continue to follow with endocrinology.

## 2023-11-09 NOTE — Progress Notes (Signed)
[facility] [address] [place] [address] Dept: [phone] Dept Fax: [phone]  Chronic Care Office Visit  Subjective:    Patient ID: Laura Campbell, female    DOB: 1961/09/07, 63 y.o..   MRN: [id number]  Chief Complaint  Patient presents with   Diabetes    3 month f/u DM.  Fasting today.  No concerns.    History of Present Illness:  Patient is in today for reassessment of chronic medical issues.  Ms. Beaupre has a history of Type 2 diabetes. She is now established with Dr. Erroll Luna (endocrinology). She has been started on insulin glargine (Lantus). She notes her fasting glucose today was 101. She has been seeing her glucose get to 130s after meals. She denies any trouble using her insulin pen.  Ms. Spano has a history of hypertension. She is currently on HCTZ 25 mg daily, spironolactone 25 mg daily, and telmisartan 80 mg daily. She had swelling previously attributed to amlodipine use, which was stopped.    Ms. Feild has a history of hyperlipidemia and is managed on rosuvastatin 5 mg daily.  Ms. Westerfeld complains of right ankle pain. This esp. hurts first thing int he morning, but lingers throughout the day. Ms. Cocke bowls in a league, so bowls ~ 9 games a week. She feels the pain is effecting her bowling, but doesn't feel her bowling is making the issue worse. She did have an injection in her sole for plantar fasciitis, which did help. She has been on meloxicam and was referred by Dr. Dion Saucier for PT. She has not gone to PT, as she feels this will not help her issue. She feels she needs an MRI scan to find out what is wrong with the ankle.  Past Medical History: Patient Active Problem List   Diagnosis Date Noted   Chronic kidney disease, stage 3a (HCC) 07/08/2023   Morbid obesity with BMI of 40.0-44.9, adult (HCC) 04/15/2022   Protein C deficiency (HCC) 04/15/2022   Protein S deficiency (HCC) 04/15/2022   History  of colon polyps 12/11/2021   Diverticulosis 12/11/2021   Lumbar radiculopathy, chronic 05/07/2021   Spinal stenosis, lumbar region without neurogenic claudication 05/07/2021   Essential hypertension 05/07/2021   History of DVT (deep vein thrombosis) 05/07/2021   History of pulmonary embolus (PE) 05/07/2021   Hyperlipidemia 05/07/2021   Chronic low back pain 05/07/2021   Hereditary and idiopathic peripheral neuropathy 03/27/2015   Left foot pain 03/27/2015   Type 2 diabetes mellitus (HCC) 03/27/2015   Past Surgical History:  Procedure Laterality Date   TONSILLECTOMY     TOTAL ABDOMINAL HYSTERECTOMY  10/06/2008   Family History  Problem Relation Age of Onset   Heart disease Mother    Cirrhosis Father    Stroke Sister    Diabetes Sister    Cancer Sister        Breast   Breast cancer Maternal Aunt    Breast cancer Cousin    Neuropathy Neg Hx    Colon cancer Neg Hx    Esophageal cancer Neg Hx    Liver cancer Neg Hx    Pancreatic cancer Neg Hx    Outpatient Medications Prior to Visit  Medication Sig Dispense Refill   Blood Glucose Monitoring Suppl (ONE TOUCH ULTRA MINI) W/DEVICE KIT      Blood Glucose Monitoring Suppl DEVI 1 each by Does not apply route every morning. May substitute to any manufacturer covered by patient's insurance. 1 each 0  ELIQUIS 2.5 MG TABS tablet TAKE ONE TABLET BY MOUTH TWICE DAILY @ 9AM-5PM 60 tablet 11   EPINEPHrine 0.3 mg/0.3 mL IJ SOAJ injection Inject 0.3 mg into the muscle as needed for anaphylaxis. 2 each 0   Glucose Blood (BLOOD GLUCOSE TEST STRIPS) STRP 1 each by In Vitro route every morning. May substitute to any manufacturer covered by patient's insurance. 100 strip 3   hydrochlorothiazide (HYDRODIURIL) 25 MG tablet TAKE ONE TABLET BY MOUTH DAILY AT 9AM 90 tablet 11   Insulin Pen Needle 32G X 4 MM MISC Use 1x a day 100 each 3   meloxicam (MOBIC) 15 MG tablet Take 15 mg by mouth daily.     rosuvastatin (CRESTOR) 5 MG tablet TAKE ONE TABLET (5  MG TOTAL) BY MOUTH DAILY AT 5PM 90 tablet 3   spironolactone (ALDACTONE) 25 MG tablet TAKE ONE TABLET (25MG  TOTAL) BY MOUTH EVERY MORNING 90 tablet 3   telmisartan (MICARDIS) 80 MG tablet Take 1 tablet (80 mg total) by mouth daily. 90 tablet 2   insulin glargine (LANTUS SOLOSTAR) 100 UNIT/ML Solostar Pen start 20 units daily in the morning, increase by 2 units every 5 days, with maximum 30 units / day until fasting glucose in low 100 s range. 15 mL 11   insulin glargine (LANTUS SOLOSTAR) 100 UNIT/ML Solostar Pen start 20 units daily in the morning, increase by 2 units every 5 days, with maximum 30 units / day until fasting glucose in low 100 s range. 15 mL 4   glucose blood test strip 1 each by Other route as needed for other. Use as instructed     glucose blood test strip 1 each by Other route as needed for other. Use as instructed     ondansetron (ZOFRAN-ODT) 4 MG disintegrating tablet Take 1 tablet (4 mg total) by mouth every 8 (eight) hours as needed for nausea or vomiting. (Patient not taking: Reported on 07/27/2023) 20 tablet 0   No facility-administered medications prior to visit.   Allergies  Allergen Reactions   Januvia [Sitagliptin] Anaphylaxis    Pt reported being treated at the ED after taking Januvia and experiencing swelling of the throat.   Jardiance [Empagliflozin] Other (See Comments)    Hypotension   Metformin And Related Other (See Comments)    GI Intolerance   Pioglitazone Swelling   Objective:   Today's Vitals   11/09/23 0957  BP: 122/70  Pulse: (!) 56  Temp: 98 F (36.7 C)  TempSrc: Temporal  SpO2: 98%  Weight: 237 lb 3.2 oz (107.6 kg)  Height: 5\' 2"  (1.575 m)   Body mass index is 43.38 kg/m.   General: Well developed, well nourished. No acute distress. Extremities: Pain noted over the anteromedial ankle joint. No swelling noted. No laxity. Feet- Skin intact. No sign of maceration between toes. Nails are normal. Dorsalis pedis and posterior   tibial  artery pulses are normal. 5.07 monofilament testing normal. Psych: Alert and oriented. Normal mood and affect.  Health Maintenance Due  Topic Date Due   OPHTHALMOLOGY EXAM  08/10/2023   Lab Results    Latest Ref Rng & Units 07/08/2023   11:09 AM 04/07/2023   10:54 AM 02/21/2023   11:07 AM  CMP  Glucose 70 - 99 mg/dL 409  811  914   BUN 6 - 23 mg/dL 27  25  27    Creatinine 0.40 - 1.20 mg/dL 7.82  9.56  2.13   Sodium 135 - 145 mEq/L 138  135  139   Potassium 3.5 - 5.1 mEq/L 4.7  5.4  4.0   Chloride 96 - 112 mEq/L 104  105  106   CO2 19 - 32 mEq/L 26  24  25    Calcium 8.4 - 10.5 mg/dL 16.1  09.6  04.5   Total Protein 6.5 - 8.1 g/dL   7.6   Total Bilirubin 0.3 - 1.2 mg/dL   1.0   Alkaline Phos 38 - 126 U/L   58   AST 15 - 41 U/L   12   ALT 0 - 44 U/L   12    Last lipids Lab Results  Component Value Date   CHOL 176 12/20/2021   HDL 67 12/20/2021   LDLCALC 91 12/20/2021   TRIG 85 12/20/2021   CHOLHDL 2.6 12/20/2021   Last hemoglobin A1c Lab Results  Component Value Date   HGBA1C 9.1 (A) 10/14/2023   Assessment & Plan:   Problem List Items Addressed This Visit       Cardiovascular and Mediastinum   Essential hypertension - Primary   Ms. Tays's BP is at goal. Continue HCTZ 25 mg daily, spironolactone 25 mg daily, and telmisartan 80 mg daily.        Endocrine   Type 2 diabetes mellitus (HCC)   Ms. Frame's A1c has been above goal. She is now on Lantus under the care of Dr. Erroll Luna. Her fasting blood sugar sounds improved. We will continue to follow with endocrinology.         Genitourinary   Chronic kidney disease, stage 3a (HCC)   Continue focus on blood pressure and glucose control, adequate hydration, and avoidance of nephrotoxic medications.      Other Visit Diagnoses       Acute right ankle pain       Working with orthopedics. Has failed NSAID use. No willign to try PT. Follow-up wiht Dr. Dion Saucier.       Return in about 3 months (around 02/06/2024) for  Reassessment.   Loyola Mast, MD

## 2023-11-23 ENCOUNTER — Telehealth: Payer: Self-pay

## 2023-11-23 NOTE — Telephone Encounter (Signed)
 Patient was identified as falling into the [place] Measure - Diabetes.   Patient was: Appointment scheduled for lab or office visit for A1c.

## 2023-11-26 ENCOUNTER — Ambulatory Visit: Payer: Medicare Other | Admitting: Endocrinology

## 2023-12-01 ENCOUNTER — Encounter: Payer: Self-pay | Admitting: Endocrinology

## 2023-12-01 ENCOUNTER — Ambulatory Visit: Payer: Medicare Other | Admitting: Endocrinology

## 2023-12-01 VITALS — BP 128/80 | HR 83 | Resp 20 | Ht 62.0 in | Wt 240.8 lb

## 2023-12-01 DIAGNOSIS — Z794 Long term (current) use of insulin: Secondary | ICD-10-CM | POA: Diagnosis not present

## 2023-12-01 DIAGNOSIS — E1169 Type 2 diabetes mellitus with other specified complication: Secondary | ICD-10-CM | POA: Diagnosis not present

## 2023-12-01 MED ORDER — LANTUS SOLOSTAR 100 UNIT/ML ~~LOC~~ SOPN: 22.0000 [IU] | PEN_INJECTOR | Freq: Every day | SUBCUTANEOUS | 4 refills | Status: DC
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
## 2023-12-05 DIAGNOSIS — E119 Type 2 diabetes mellitus without complications: Secondary | ICD-10-CM | POA: Diagnosis not present

## 2023-12-16 NOTE — Telephone Encounter (Signed)
 Patient was identified as falling into the [place] Measure - Diabetes.   Patient was: Appointment scheduled for lab or office visit for A1c.  FOV 02/15/24 FOV w/Endo 03/08/24

## 2024-01-05 DIAGNOSIS — Z1231 Encounter for screening mammogram for malignant neoplasm of breast: Secondary | ICD-10-CM | POA: Diagnosis not present

## 2024-01-05 DIAGNOSIS — E119 Type 2 diabetes mellitus without complications: Secondary | ICD-10-CM | POA: Diagnosis not present

## 2024-01-05 LAB — HM MAMMOGRAPHY

## 2024-01-06 ENCOUNTER — Other Ambulatory Visit: Payer: Self-pay

## 2024-01-06 ENCOUNTER — Encounter (HOSPITAL_BASED_OUTPATIENT_CLINIC_OR_DEPARTMENT_OTHER): Payer: Self-pay

## 2024-01-06 ENCOUNTER — Emergency Department (HOSPITAL_BASED_OUTPATIENT_CLINIC_OR_DEPARTMENT_OTHER)
Admission: EM | Admit: 2024-01-06 | Discharge: 2024-01-06 | Disposition: A | Discharge status: Home / Self Care | Attending: Emergency Medicine | Admitting: Emergency Medicine

## 2024-01-06 ENCOUNTER — Emergency Department (HOSPITAL_BASED_OUTPATIENT_CLINIC_OR_DEPARTMENT_OTHER)

## 2024-01-06 DIAGNOSIS — W01198A Fall on same level from slipping, tripping and stumbling with subsequent striking against other object, initial encounter: Secondary | ICD-10-CM | POA: Insufficient documentation

## 2024-01-06 DIAGNOSIS — Z7901 Long term (current) use of anticoagulants: Secondary | ICD-10-CM | POA: Diagnosis not present

## 2024-01-06 DIAGNOSIS — R519 Headache, unspecified: Secondary | ICD-10-CM | POA: Insufficient documentation

## 2024-01-06 DIAGNOSIS — I6523 Occlusion and stenosis of bilateral carotid arteries: Secondary | ICD-10-CM | POA: Diagnosis not present

## 2024-01-06 DIAGNOSIS — S0990XA Unspecified injury of head, initial encounter: Secondary | ICD-10-CM | POA: Diagnosis not present

## 2024-01-06 MED ORDER — METOCLOPRAMIDE HCL 10 MG PO TABS
10.0000 mg | ORAL_TABLET | Freq: Once | ORAL | Status: AC
  Administered 2024-01-06: 10 mg via ORAL
  Filled 2024-01-06: qty 1

## 2024-01-06 MED ORDER — METOCLOPRAMIDE HCL 10 MG PO TABS: 10.0000 mg | ORAL_TABLET | Freq: Three times a day (TID) | ORAL | 0 refills | Status: DC | PRN

## 2024-01-06 NOTE — ED Triage Notes (Signed)
 Pt arrives ambulatory to ED with reports of fall on Monday hitting the top left side of her head. Pt takes eliquis 2.5 mg twice daily. Reports headaches since. Denies dizziness or blurred vision. Denies LOC states that she tripped causing her to fall and hit her head on edge of wall.

## 2024-01-06 NOTE — Discharge Instructions (Signed)
 You are having a headache. This condition is very commonly seen in the Emergency Department. No specific cause was found today for your headache. It may have been a migraine or another cause of headache. Stress, anxiety, fatigue, and depression are common triggers for headaches.   Fortunately, your headache today does not appear to be life-threatening or require hospitalization at this time. This may change in the future. Sometimes headaches can appear benign (not harmful), but then more serious symptoms can develop, which should prompt an immediate re-evaluation by your doctor or the emergency department.  It is very important that you speak to your primary care doctor in the next 48 hours, to re-evaluate your symptoms, and to determine whether you need any further diagnostic testing or treatment.   SEEK MEDICAL ATTENTION IF: - you develop reaction or side effects to the medications prescribed.  - your headache is not improving within 48 hours - you have multiple episodes of vomiting or can't drink fluids - you have a change in pattern from your usual headache (eg. New location, new intensity, new symptoms, etc)  RETURN IMMEDIATELY IF you develop a sudden, severe worsening of your headache or confusion, become poorly responsive, feel like passing out, develop a fever above 100.2F, have a change in speech, vision, or swallowing, develop new weakness, numbness, tingling, or incoordination, or have a seizure.

## 2024-01-06 NOTE — ED Provider Notes (Signed)
 [place] EMERGENCY DEPARTMENT AT [facility] Provider Note   CSN: [id number] Arrival date & time: 01/06/24  2157     History  Chief Complaint  Patient presents with   Marletta Lor    Laura Campbell is a 63 y.o. female on Eliquis presenting to ED with a headache after falling and striking her head 2 days ago while at home.  Patient says she tripped and caught the left side of her head on a counter while she fell.  She was dazed afterwards but did not want to come to the hospital.  She has had a mild to moderate persistent headache since then.  No other injuries reported.  No vomiting.  She is here with her daughter.  HPI     Home Medications Prior to Admission medications   Medication Sig Start Date End Date Taking? Authorizing Provider  metoCLOPramide (REGLAN) 10 MG tablet Take 1 tablet (10 mg total) by mouth every 8 (eight) hours as needed for up to 10 doses for nausea. 01/06/24  Yes Terald Sleeper, MD  Blood Glucose Monitoring Suppl DEVI 1 each by Does not apply route every morning. May substitute to any manufacturer covered by patient's insurance. 02/20/23   Loyola Mast, MD  ELIQUIS 2.5 MG TABS tablet TAKE ONE TABLET BY MOUTH TWICE DAILY @ 9AM-5PM 04/16/23   Loyola Mast, MD  EPINEPHrine 0.3 mg/0.3 mL IJ SOAJ injection Inject 0.3 mg into the muscle as needed for anaphylaxis. 02/21/23   Tegeler, Canary Brim, MD  Glucose Blood (BLOOD GLUCOSE TEST STRIPS) STRP 1 each by In Vitro route every morning. May substitute to any manufacturer covered by patient's insurance. 02/20/23   Loyola Mast, MD  hydrochlorothiazide (HYDRODIURIL) 25 MG tablet TAKE ONE TABLET BY MOUTH DAILY AT 9AM 06/01/23   Loyola Mast, MD  insulin glargine (LANTUS SOLOSTAR) 100 UNIT/ML Solostar Pen Inject 22 Units into the skin daily. 12/01/23   Thapa, [place], MD  Insulin Pen Needle 32G X 4 MM MISC Use 1x a day 10/14/23   Thapa, [place], MD  meloxicam (MOBIC) 15 MG tablet Take 15 mg by mouth daily. 01/01/23    [provider]  rosuvastatin (CRESTOR) 5 MG tablet TAKE ONE TABLET (5 MG TOTAL) BY MOUTH DAILY AT 5PM 05/04/23   Loyola Mast, MD  spironolactone (ALDACTONE) 25 MG tablet TAKE ONE TABLET (25MG  TOTAL) BY MOUTH EVERY MORNING 03/27/23   Loyola Mast, MD  telmisartan (MICARDIS) 80 MG tablet Take 1 tablet (80 mg total) by mouth daily. 07/08/23   Loyola Mast, MD      Allergies    Januvia [sitagliptin], Jardiance [empagliflozin], Metformin and related, and Pioglitazone    Review of Systems   Review of Systems  Physical Exam Updated Vital Signs BP (!) 147/86 (BP Location: Left Arm)   Pulse 61   Temp 97.9 F (36.6 C)   Resp 18   Ht 5\' 1"  (1.549 m)   Wt 104.3 kg   SpO2 95%   BMI 43.46 kg/m  Physical Exam Constitutional:      General: She is not in acute distress. HENT:     Head: Normocephalic and atraumatic.  Eyes:     Conjunctiva/sclera: Conjunctivae normal.     Pupils: Pupils are equal, round, and reactive to light.  Cardiovascular:     Rate and Rhythm: Normal rate and regular rhythm.  Pulmonary:     Effort: Pulmonary effort is normal. No respiratory distress.  Skin:  General: Skin is warm and dry.  Neurological:     General: No focal deficit present.     Mental Status: She is alert and oriented to person, place, and time. Mental status is at baseline.  Psychiatric:        Mood and Affect: Mood normal.        Behavior: Behavior normal.     ED Results / Procedures / Treatments   Labs (all labs ordered are listed, but only abnormal results are displayed) Labs Reviewed - No data to display  EKG None  Radiology CT Head Wo Contrast Result Date: 01/06/2024 CLINICAL DATA:  Polytrauma, blunt EXAM: CT HEAD WITHOUT CONTRAST TECHNIQUE: Contiguous axial images were obtained from the base of the skull through the vertex without intravenous contrast. RADIATION DOSE REDUCTION: This exam was performed according to the departmental dose-optimization program which  includes automated exposure control, adjustment of the mA and/or kV according to patient size and/or use of iterative reconstruction technique. COMPARISON:  None Available. FINDINGS: Brain: No evidence of large-territorial acute infarction. No parenchymal hemorrhage. No mass lesion. No extra-axial collection. No mass effect or midline shift. No hydrocephalus. Basilar cisterns are patent. Empty sella. Vascular: No hyperdense vessel. Atherosclerotic calcifications are present within the cavernous internal carotid arteries. Skull: No acute fracture or focal lesion. Sinuses/Orbits: Paranasal sinuses and mastoid air cells are clear. The orbits are unremarkable. Other: None. IMPRESSION: 1. No acute intracranial abnormality. 2. Empty sella. Findings is often a normal anatomic variant but can be associated with idiopathic intracranial hypertension (pseudotumor cerebri). Electronically Signed   By: Tish Frederickson M.D.   On: 01/06/2024 22:28    Procedures Procedures    Medications Ordered in ED Medications  metoCLOPramide (REGLAN) tablet 10 mg (10 mg Oral Given 01/06/24 2217)    ED Course/ Medical Decision Making/ A&P                                 Medical Decision Making Amount and/or Complexity of Data Reviewed Radiology: ordered.  Risk Prescription drug management.   Patient is presenting with a mechanical fall 3 days ago and head injury, on Eliquis.  She has had a persistent headache.  She will need a CT scan of the head.  Low suspicion for spinal fracture, no other traumatic injuries noted on exam.  She is neurologically intact.  CT imaging reviewed, no emergent findings.  Questionable empty sella turcica but no history otherwise consistent with pseudotumor.  Patient is stable for discharge in the company of her daughter.        Final Clinical Impression(s) / ED Diagnoses Final diagnoses:  Nonintractable headache, unspecified chronicity pattern, unspecified headache type    Rx /  DC Orders ED Discharge Orders          Ordered    metoCLOPramide (REGLAN) 10 MG tablet  Every 8 hours PRN        01/06/24 2247              Terald Sleeper, MD 01/06/24 2256

## 2024-01-07 ENCOUNTER — Encounter: Payer: Self-pay | Admitting: Family Medicine

## 2024-01-08 ENCOUNTER — Other Ambulatory Visit: Payer: Self-pay | Admitting: Family Medicine

## 2024-01-08 DIAGNOSIS — Z86718 Personal history of other venous thrombosis and embolism: Secondary | ICD-10-CM

## 2024-01-08 DIAGNOSIS — E785 Hyperlipidemia, unspecified: Secondary | ICD-10-CM

## 2024-01-08 DIAGNOSIS — Z86711 Personal history of pulmonary embolism: Secondary | ICD-10-CM

## 2024-01-08 DIAGNOSIS — I10 Essential (primary) hypertension: Secondary | ICD-10-CM

## 2024-01-08 DIAGNOSIS — D6859 Other primary thrombophilia: Secondary | ICD-10-CM

## 2024-01-08 MED ORDER — APIXABAN 2.5 MG PO TABS: 2.5000 mg | ORAL_TABLET | Freq: Two times a day (BID) | ORAL | 1 refills | Status: DC

## 2024-01-08 MED ORDER — HYDROCHLOROTHIAZIDE 25 MG PO TABS: 25.0000 mg | ORAL_TABLET | Freq: Every day | ORAL | 1 refills | Status: DC

## 2024-01-08 MED ORDER — ROSUVASTATIN CALCIUM 5 MG PO TABS: 5.0000 mg | ORAL_TABLET | Freq: Every day | ORAL | 1 refills | Status: DC

## 2024-01-08 MED ORDER — TELMISARTAN 80 MG PO TABS: 80.0000 mg | ORAL_TABLET | Freq: Every day | ORAL | 1 refills | Status: AC

## 2024-01-08 NOTE — Telephone Encounter (Signed)
 Copied from CRM [id number]. Topic: Clinical - Medication Refill >> Jan 08, 2024  1:48 PM Elizebeth Brooking wrote: Most Recent Primary Care Visit:  Provider: Loyola Mast  Department: [facility]  Visit Type: OFFICE VISIT  Date: 11/09/2023  Medication: ELIQUIS 2.5 MG TABS tablet  rosuvastatin (CRESTOR) 5 MG tablet  Has the patient contacted their pharmacy? Yes (Agent: If no, request that the patient contact the pharmacy for the refill. If patient does not wish to contact the pharmacy document the reason why and proceed with request.) (Agent: If yes, when and what did the pharmacy advise?)  Is this the correct pharmacy for this prescription? Yes If no, delete pharmacy and type the correct one.  This is the patient's preferred pharmacy:  [id number] - [place], [place] - [address] AT [facility] [address] [place] [address] Phone: [phone] Fax: [phone]    Has the prescription been filled recently? No  Is the patient out of the medication? Yes  Has the patient been seen for an appointment in the last year OR does the patient have an upcoming appointment? Yes  Can we respond through MyChart? Yes  Agent: Please be advised that Rx refills may take up to 3 business days. We ask that you follow-up with your pharmacy.

## 2024-01-08 NOTE — Telephone Encounter (Signed)
 Copied from CRM [id number]. Topic: Clinical - Medication Refill >> Jan 08, 2024  1:51 PM Elizebeth Brooking wrote: Most Recent Primary Care Visit:  Provider: Loyola Mast  Department: [facility]  Visit Type: OFFICE VISIT  Date: 11/09/2023  Medication: hydrochlorothiazide (HYDRODIURIL) 25 MG tablet telmisartan (MICARDIS) 80 MG tablet  Has the patient contacted their pharmacy? Yes (Agent: If no, request that the patient contact the pharmacy for the refill. If patient does not wish to contact the pharmacy document the reason why and proceed with request.) (Agent: If yes, when and what did the pharmacy advise?)  Is this the correct pharmacy for this prescription? Yes If no, delete pharmacy and type the correct one.  This is the patient's preferred pharmacy:  [id number] - [place], [place] - [address] AT [facility] [address] [place] [address] Phone: [phone] Fax: [phone]   Has the prescription been filled recently? No  Is the patient out of the medication? Yes  Has the patient been seen for an appointment in the last year OR does the patient have an upcoming appointment? Yes  Can we respond through MyChart? Yes  Agent: Please be advised that Rx refills may take up to 3 business days. We ask that you follow-up with your pharmacy.

## 2024-02-01 DIAGNOSIS — E119 Type 2 diabetes mellitus without complications: Secondary | ICD-10-CM | POA: Diagnosis not present

## 2024-02-01 LAB — HM DIABETES EYE EXAM

## 2024-02-02 ENCOUNTER — Encounter: Payer: Self-pay | Admitting: Family Medicine

## 2024-02-04 DIAGNOSIS — E119 Type 2 diabetes mellitus without complications: Secondary | ICD-10-CM | POA: Diagnosis not present

## 2024-02-05 DIAGNOSIS — M5416 Radiculopathy, lumbar region: Secondary | ICD-10-CM | POA: Diagnosis not present

## 2024-02-12 DIAGNOSIS — D689 Coagulation defect, unspecified: Secondary | ICD-10-CM | POA: Diagnosis not present

## 2024-02-12 LAB — HEMOGLOBIN A1C: Hemoglobin A1C: 7

## 2024-02-15 ENCOUNTER — Ambulatory Visit (INDEPENDENT_AMBULATORY_CARE_PROVIDER_SITE_OTHER): Payer: Medicare Other | Admitting: Family Medicine

## 2024-02-15 VITALS — BP 116/70 | HR 68 | Temp 97.2°F | Ht 61.0 in | Wt 244.8 lb

## 2024-02-15 DIAGNOSIS — Z794 Long term (current) use of insulin: Secondary | ICD-10-CM

## 2024-02-15 DIAGNOSIS — E785 Hyperlipidemia, unspecified: Secondary | ICD-10-CM | POA: Diagnosis not present

## 2024-02-15 DIAGNOSIS — I10 Essential (primary) hypertension: Secondary | ICD-10-CM

## 2024-02-15 DIAGNOSIS — E119 Type 2 diabetes mellitus without complications: Secondary | ICD-10-CM | POA: Diagnosis not present

## 2024-02-15 DIAGNOSIS — N1831 Chronic kidney disease, stage 3a: Secondary | ICD-10-CM

## 2024-02-15 NOTE — Assessment & Plan Note (Signed)
 Laura Campbell's BP is at goal. Continue HCTZ 25 mg daily, spironolactone 25 mg daily, and telmisartan 80 mg daily.

## 2024-02-15 NOTE — Assessment & Plan Note (Signed)
 Laura Campbell A1c is improved according to her recent POC testing. Continue insulin  glargine (Lantus ) 22 units at bedtime.

## 2024-02-15 NOTE — Progress Notes (Signed)
 [facility]ARE [facility] [address] [place] [address] Dept: [phone] Dept Fax: [phone]  Chronic Care Office Visit  Subjective:    Patient ID: Laura Campbell, female    DOB: [birth year], 63 y.o..   MRN: [id number]  Chief Complaint  Patient presents with   Hypertension    3 month f/u HTN.   No concerns.     History of Present Illness:  Patient is in today for reassessment of chronic medical issues.  Laura Campbell has a history of Type 2 diabetes. She sees Dr. Aretha Kubas (endocrinology). She is managed on insulin  glargine (Lantus ) 22 units at bedtime. She had a recetn A1c as part of a wellness visit by her insurance. This was 7.0%, which is improved.   Laura Campbell has a history of hypertension. She is currently on HCTZ 25 mg daily, spironolactone  25 mg daily, and telmisartan  80 mg daily.   Laura Campbell has a history of hyperlipidemia and is managed on rosuvastatin  5 mg daily.  Past Medical History: Patient Active Problem List   Diagnosis Date Noted   Chronic kidney disease, stage 3a (HCC) 07/08/2023   Morbid obesity with BMI of 40.0-44.9, adult (HCC) 04/15/2022   Protein C deficiency (HCC) 04/15/2022   Protein S deficiency (HCC) 04/15/2022   History of colon polyps 12/11/2021   Diverticulosis 12/11/2021   Lumbar radiculopathy, chronic 05/07/2021   Spinal stenosis, lumbar region without neurogenic claudication 05/07/2021   Essential hypertension 05/07/2021   History of DVT (deep vein thrombosis) 05/07/2021   History of pulmonary embolus (PE) 05/07/2021   Hyperlipidemia 05/07/2021   Chronic low back pain 05/07/2021   Hereditary and idiopathic peripheral neuropathy 03/27/2015   Left foot pain 03/27/2015   Type 2 diabetes mellitus (HCC) 03/27/2015   Past Surgical History:  Procedure Laterality Date   TONSILLECTOMY     TOTAL ABDOMINAL HYSTERECTOMY  10/06/2008   Family History  Problem Relation Age of Onset   Heart disease  Mother    Cirrhosis Father    Stroke Sister    Diabetes Sister    Cancer Sister        Breast   Breast cancer Maternal Aunt    Breast cancer Cousin    Neuropathy Neg Hx    Colon cancer Neg Hx    Esophageal cancer Neg Hx    Liver cancer Neg Hx    Pancreatic cancer Neg Hx    Outpatient Medications Prior to Visit  Medication Sig Dispense Refill   apixaban  (ELIQUIS ) 2.5 MG TABS tablet Take 1 tablet (2.5 mg total) by mouth 2 (two) times daily. 60 tablet 1   Blood Glucose Monitoring Suppl DEVI 1 each by Does not apply route every morning. May substitute to any manufacturer covered by patient's insurance. 1 each 0   EPINEPHrine  0.3 mg/0.3 mL IJ SOAJ injection Inject 0.3 mg into the muscle as needed for anaphylaxis. 2 each 0   Glucose Blood (BLOOD GLUCOSE TEST STRIPS) STRP 1 each by In Vitro route every morning. May substitute to any manufacturer covered by patient's insurance. 100 strip 3   hydrochlorothiazide  (HYDRODIURIL ) 25 MG tablet Take 1 tablet (25 mg total) by mouth daily. 90 tablet 1   insulin  glargine (LANTUS  SOLOSTAR) 100 UNIT/ML Solostar Pen Inject 22 Units into the skin daily. 15 mL 4   Insulin  Pen Needle 32G X 4 MM MISC Use 1x a day 100 each 3   meloxicam  (MOBIC ) 15 MG tablet Take 15 mg by mouth daily.  rosuvastatin  (CRESTOR ) 5 MG tablet Take 1 tablet (5 mg total) by mouth daily. 90 tablet 1   spironolactone  (ALDACTONE ) 25 MG tablet TAKE ONE TABLET (25MG  TOTAL) BY MOUTH EVERY MORNING 90 tablet 3   telmisartan  (MICARDIS ) 80 MG tablet Take 1 tablet (80 mg total) by mouth daily. 90 tablet 1   metoCLOPramide  (REGLAN ) 10 MG tablet Take 1 tablet (10 mg total) by mouth every 8 (eight) hours as needed for up to 10 doses for nausea. 10 tablet 0   No facility-administered medications prior to visit.   Allergies  Allergen Reactions   Januvia  [Sitagliptin ] Anaphylaxis    Pt reported being treated at the ED after taking Januvia  and experiencing swelling of the throat.   Jardiance  [Empagliflozin] Other (See Comments)    Hypotension   Metformin  And Related Other (See Comments)    GI Intolerance   Pioglitazone  Swelling   Objective:   Today's Vitals   02/15/24 0952  BP: 116/70  Pulse: 68  Temp: (!) 97.2 F (36.2 C)  TempSrc: Temporal  SpO2: 98%  Weight: 244 lb 12.8 oz (111 kg)  Height: 5\' 1"  (1.549 m)   Body mass index is 46.25 kg/m.   General: Well developed, well nourished. No acute distress. Psych: Alert and oriented. Normal mood and affect.  There are no preventive care reminders to display for this patient.    Assessment & Plan:   Problem List Items Addressed This Visit       Cardiovascular and Mediastinum   Essential hypertension - Primary   Laura Campbell's BP is at goal. Continue HCTZ 25 mg daily, spironolactone  25 mg daily, and telmisartan  80 mg daily.        Endocrine   Type 2 diabetes mellitus (HCC)   Laura Campbell's A1c is improved according to her recent POC testing. Continue insulin  glargine (Lantus ) 22 units at bedtime.         Genitourinary   Chronic kidney disease, stage 3a (HCC)   Continue focus on blood pressure and glucose control, adequate hydration, and avoidance of nephrotoxic medications.        Other   Hyperlipidemia   Stable. Continue rosuvastatin  5 mg daily.       Return in about 3 months (around 05/17/2024) for Reassessment.   Graig Lawyer, MD

## 2024-02-15 NOTE — Assessment & Plan Note (Signed)
 Continue focus on blood pressure and glucose control, adequate hydration, and avoidance of nephrotoxic medications.

## 2024-02-15 NOTE — Assessment & Plan Note (Signed)
Stable.  Continue rosuvastatin 5 mg daily. 

## 2024-02-17 LAB — COMPREHENSIVE METABOLIC PANEL WITH GFR: eGFR: 90.001

## 2024-02-17 LAB — BASIC METABOLIC PANEL WITH GFR: Creatinine: 0.7 (ref 0.5–1.1)

## 2024-03-01 DIAGNOSIS — M5416 Radiculopathy, lumbar region: Secondary | ICD-10-CM | POA: Diagnosis not present

## 2024-03-02 ENCOUNTER — Encounter: Payer: Self-pay | Admitting: Family Medicine

## 2024-03-06 DIAGNOSIS — E119 Type 2 diabetes mellitus without complications: Secondary | ICD-10-CM | POA: Diagnosis not present

## 2024-03-08 ENCOUNTER — Ambulatory Visit: Payer: Medicare Other | Admitting: Endocrinology

## 2024-03-08 ENCOUNTER — Encounter: Payer: Self-pay | Admitting: Endocrinology

## 2024-03-08 VITALS — BP 110/80 | HR 60 | Resp 20 | Ht 61.0 in | Wt 249.8 lb

## 2024-03-08 DIAGNOSIS — Z794 Long term (current) use of insulin: Secondary | ICD-10-CM | POA: Diagnosis not present

## 2024-03-08 DIAGNOSIS — E1169 Type 2 diabetes mellitus with other specified complication: Secondary | ICD-10-CM

## 2024-03-08 MED ORDER — LANTUS SOLOSTAR 100 UNIT/ML ~~LOC~~ SOPN: 22.0000 [IU] | PEN_INJECTOR | Freq: Every day | SUBCUTANEOUS | 4 refills | Status: DC
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
## 2024-03-13 ENCOUNTER — Other Ambulatory Visit: Payer: Self-pay | Admitting: Family Medicine

## 2024-03-13 DIAGNOSIS — Z86718 Personal history of other venous thrombosis and embolism: Secondary | ICD-10-CM

## 2024-03-13 DIAGNOSIS — D6859 Other primary thrombophilia: Secondary | ICD-10-CM

## 2024-03-13 DIAGNOSIS — Z86711 Personal history of pulmonary embolism: Secondary | ICD-10-CM

## 2024-04-04 ENCOUNTER — Other Ambulatory Visit: Payer: Self-pay | Admitting: Family Medicine

## 2024-04-04 DIAGNOSIS — I10 Essential (primary) hypertension: Secondary | ICD-10-CM

## 2024-04-05 DIAGNOSIS — E119 Type 2 diabetes mellitus without complications: Secondary | ICD-10-CM | POA: Diagnosis not present

## 2024-04-19 ENCOUNTER — Other Ambulatory Visit: Payer: Self-pay | Admitting: Family Medicine

## 2024-04-19 DIAGNOSIS — I10 Essential (primary) hypertension: Secondary | ICD-10-CM

## 2024-04-19 DIAGNOSIS — E785 Hyperlipidemia, unspecified: Secondary | ICD-10-CM

## 2024-05-06 DIAGNOSIS — E119 Type 2 diabetes mellitus without complications: Secondary | ICD-10-CM | POA: Diagnosis not present

## 2024-05-17 ENCOUNTER — Ambulatory Visit: Payer: Self-pay | Admitting: Family Medicine

## 2024-05-17 ENCOUNTER — Other Ambulatory Visit: Payer: Self-pay | Admitting: Family Medicine

## 2024-05-17 ENCOUNTER — Ambulatory Visit (INDEPENDENT_AMBULATORY_CARE_PROVIDER_SITE_OTHER): Admitting: Family Medicine

## 2024-05-17 ENCOUNTER — Encounter: Payer: Self-pay | Admitting: Family Medicine

## 2024-05-17 VITALS — BP 122/74 | HR 64 | Temp 97.6°F | Ht 61.0 in

## 2024-05-17 DIAGNOSIS — E1122 Type 2 diabetes mellitus with diabetic chronic kidney disease: Secondary | ICD-10-CM | POA: Diagnosis not present

## 2024-05-17 DIAGNOSIS — D6859 Other primary thrombophilia: Secondary | ICD-10-CM

## 2024-05-17 DIAGNOSIS — Z794 Long term (current) use of insulin: Secondary | ICD-10-CM

## 2024-05-17 DIAGNOSIS — Z86711 Personal history of pulmonary embolism: Secondary | ICD-10-CM

## 2024-05-17 DIAGNOSIS — E785 Hyperlipidemia, unspecified: Secondary | ICD-10-CM | POA: Diagnosis not present

## 2024-05-17 DIAGNOSIS — N1831 Chronic kidney disease, stage 3a: Secondary | ICD-10-CM | POA: Diagnosis not present

## 2024-05-17 DIAGNOSIS — I10 Essential (primary) hypertension: Secondary | ICD-10-CM

## 2024-05-17 DIAGNOSIS — Z86718 Personal history of other venous thrombosis and embolism: Secondary | ICD-10-CM

## 2024-05-17 DIAGNOSIS — E119 Type 2 diabetes mellitus without complications: Secondary | ICD-10-CM | POA: Diagnosis not present

## 2024-05-17 LAB — URINALYSIS, ROUTINE W REFLEX MICROSCOPIC
Bilirubin Urine: NEGATIVE
Hgb urine dipstick: NEGATIVE
Ketones, ur: NEGATIVE
Leukocytes,Ua: NEGATIVE
Nitrite: NEGATIVE
Specific Gravity, Urine: 1.02 (ref 1.000–1.030)
Total Protein, Urine: NEGATIVE
Urine Glucose: NEGATIVE
Urobilinogen, UA: 0.2 (ref 0.0–1.0)
pH: 6 (ref 5.0–8.0)

## 2024-05-17 LAB — LIPID PANEL
Cholesterol: 168 mg/dL (ref 0–200)
HDL: 53.9 mg/dL (ref >39.00)
LDL Cholesterol: 99 mg/dL (ref 0–99)
NonHDL: 114.29
Total CHOL/HDL Ratio: 3
Triglycerides: 76 mg/dL (ref 0.0–149.0)
VLDL: 15.2 mg/dL (ref 0.0–40.0)

## 2024-05-17 LAB — BASIC METABOLIC PANEL WITH GFR
BUN: 27 mg/dL — ABNORMAL HIGH (ref 6–23)
CO2: 24 meq/L (ref 19–32)
Calcium: 10.2 mg/dL (ref 8.4–10.5)
Chloride: 105 meq/L (ref 96–112)
Creatinine, Ser: 1.11 mg/dL (ref 0.40–1.20)
GFR: 52.2 mL/min — ABNORMAL LOW (ref >60.00)
Glucose, Bld: 89 mg/dL (ref 70–99)
Potassium: 4.4 meq/L (ref 3.5–5.1)
Sodium: 138 meq/L (ref 135–145)

## 2024-05-17 LAB — MICROALBUMIN / CREATININE URINE RATIO
Creatinine,U: 107.2 mg/dL
Microalb Creat Ratio: UNDETERMINED mg/g (ref 0.0–30.0)
Microalb, Ur: <0.7 mg/dL

## 2024-05-17 LAB — HEMOGLOBIN A1C: Hgb A1c MFr Bld: 7.5 % — ABNORMAL HIGH (ref 4.6–6.5)

## 2024-05-17 NOTE — Assessment & Plan Note (Signed)
 Laura Campbell's BP is at goal. Continue HCTZ 25 mg daily, spironolactone  25 mg daily, and telmisartan  80 mg daily. I will check renal labs today.

## 2024-05-17 NOTE — Progress Notes (Signed)
 [facility] [address] [place] [address] Dept: [phone] Dept Fax: [phone]  Chronic Care Office Visit  Subjective:    Patient ID: Laura Campbell, female    DOB: July 22, 1961, 63 y.o..   MRN: [id number]  Chief Complaint  Patient presents with   Hypertension    3 month f/u HTN/DM.  BS average 91-102 at home   History of Present Illness:  Patient is in today for reassessment of chronic medical issues.  Laura Campbell has a history of Type 2 diabetes. She sees Dr. Mercie (endocrinology). She is managed on insulin  glargine (Lantus ) 22 units each morning. Reports fasting glucose is 91-102.   Laura Campbell has a history of hypertension. She is currently on HCTZ 25 mg daily, spironolactone  25 mg daily, and telmisartan  80 mg daily.   Laura Campbell has a history of hyperlipidemia and is managed on rosuvastatin  5 mg daily.  Past Medical History: Patient Active Problem List   Diagnosis Date Noted   Chronic kidney disease, stage 3a (HCC) 07/08/2023   Morbid obesity with BMI of 40.0-44.9, adult (HCC) 04/15/2022   Protein C deficiency (HCC) 04/15/2022   Protein S deficiency (HCC) 04/15/2022   History of colon polyps 12/11/2021   Diverticulosis 12/11/2021   Lumbar radiculopathy, chronic 05/07/2021   Spinal stenosis, lumbar region without neurogenic claudication 05/07/2021   Essential hypertension 05/07/2021   History of DVT (deep vein thrombosis) 05/07/2021   History of pulmonary embolus (PE) 05/07/2021   Hyperlipidemia 05/07/2021   Chronic low back pain 05/07/2021   Hereditary and idiopathic peripheral neuropathy 03/27/2015   Left foot pain 03/27/2015   Type 2 diabetes mellitus (HCC) 03/27/2015   Past Surgical History:  Procedure Laterality Date   TONSILLECTOMY     TOTAL ABDOMINAL HYSTERECTOMY  10/06/2008   Family History  Problem Relation Age of Onset   Heart disease Mother    Cirrhosis Father    Stroke Sister     Diabetes Sister    Cancer Sister        Breast   Breast cancer Maternal Aunt    Breast cancer Cousin    Neuropathy Neg Hx    Colon cancer Neg Hx    Esophageal cancer Neg Hx    Liver cancer Neg Hx    Pancreatic cancer Neg Hx    Outpatient Medications Prior to Visit  Medication Sig Dispense Refill   apixaban  (ELIQUIS ) 2.5 MG TABS tablet TAKE 1 TABLET BY MOUTH TWICE A DAY 60 tablet 11   EPINEPHrine  0.3 mg/0.3 mL IJ SOAJ injection Inject 0.3 mg into the muscle as needed for anaphylaxis. 2 each 0   hydrochlorothiazide  (HYDRODIURIL ) 25 MG tablet TAKE 1 TABLET (25 MG TOTAL) BY MOUTH DAILY. 90 tablet 1   insulin  glargine (LANTUS  SOLOSTAR) 100 UNIT/ML Solostar Pen Inject 22 Units into the skin daily. 15 mL 4   Insulin  Pen Needle 32G X 4 MM MISC Use 1x a day 100 each 3   meloxicam  (MOBIC ) 15 MG tablet Take 15 mg by mouth daily.     rosuvastatin  (CRESTOR ) 5 MG tablet TAKE 1 TABLET (5 MG TOTAL) BY MOUTH DAILY. 90 tablet 1   spironolactone  (ALDACTONE ) 25 MG tablet TAKE 1 TABLET BY MOUTH EVERY DAY IN THE MORNING 90 tablet 1   telmisartan  (MICARDIS ) 80 MG tablet Take 1 tablet (80 mg total) by mouth daily. 90 tablet 1   Blood Glucose Monitoring Suppl DEVI 1 each by Does not apply route every morning. May  substitute to any manufacturer covered by patient's insurance. 1 each 0   Glucose Blood (BLOOD GLUCOSE TEST STRIPS) STRP 1 each by In Vitro route every morning. May substitute to any manufacturer covered by patient's insurance. 100 strip 3   No facility-administered medications prior to visit.   Allergies  Allergen Reactions   Januvia  [Sitagliptin ] Anaphylaxis    Pt reported being treated at the ED after taking Januvia  and experiencing swelling of the throat.   Jardiance [Empagliflozin] Other (See Comments)    Hypotension   Metformin  And Related Other (See Comments)    GI Intolerance   Pioglitazone  Swelling   Objective:   Today's Vitals   05/17/24 0946  BP: 122/74  Pulse: 64  Temp: 97.6 F  (36.4 C)  TempSrc: Temporal  SpO2: 100%  Height: 5' 1 (1.549 m)   Body mass index is 47.2 kg/m.   General: Well developed, well nourished. No acute distress. Psych: Alert and oriented. Normal mood and affect.  Health Maintenance Due  Topic Date Due   Diabetic kidney evaluation - Urine ACR  Never done     Assessment & Plan:   Problem List Items Addressed This Visit       Cardiovascular and Mediastinum   Essential hypertension   Laura Campbell's BP is at goal. Continue HCTZ 25 mg daily, spironolactone  25 mg daily, and telmisartan  80 mg daily. I will check renal labs today.      Relevant Orders   Basic metabolic panel with GFR     Endocrine   Type 2 diabetes mellitus with stage 3a chronic kidney disease, with long-term current use of insulin  (HCC) - Primary   I will check annual DM labs today. Continue insulin  glargine (Lantus ) 22 units at bedtime.       Relevant Orders   Lipid panel   Microalbumin / creatinine urine ratio   Basic metabolic panel with GFR   Hemoglobin A1c   Urinalysis, Routine w reflex microscopic     Genitourinary   Chronic kidney disease, stage 3a (HCC)   Continue focus on blood pressure and glucose control, adequate hydration, and avoidance of nephrotoxic medications.      Relevant Orders   Microalbumin / creatinine urine ratio   Basic metabolic panel with GFR     Other   Hyperlipidemia   I will check lipids today. Continue rosuvastatin  5 mg daily.      Relevant Orders   Lipid panel    Return in about 4 months (around 09/16/2024) for Reassessment.   Garnette CHRISTELLA Simpler, MD

## 2024-05-17 NOTE — Assessment & Plan Note (Signed)
 I will check annual DM labs today. Continue insulin  glargine (Lantus ) 22 units at bedtime.

## 2024-05-17 NOTE — Assessment & Plan Note (Signed)
 Continue focus on blood pressure and glucose control, adequate hydration, and avoidance of nephrotoxic medications.

## 2024-05-17 NOTE — Assessment & Plan Note (Signed)
 I will check lipids today. Continue rosuvastatin  5 mg daily.

## 2024-06-06 DIAGNOSIS — E119 Type 2 diabetes mellitus without complications: Secondary | ICD-10-CM | POA: Diagnosis not present

## 2024-07-06 DIAGNOSIS — E119 Type 2 diabetes mellitus without complications: Secondary | ICD-10-CM | POA: Diagnosis not present

## 2024-07-06 NOTE — Progress Notes (Signed)
 HILARIA TITSWORTH                                          MRN: [id number]   07/06/2024   The [facility]BCI Quality Team Specialist reviewed this patient medical record for the purposes of chart review for care gap closure. The following were reviewed: abstraction for care gap closure-kidney health evaluation for diabetes:eGFR  and uACR.    VBCI Quality Team

## 2024-08-03 ENCOUNTER — Other Ambulatory Visit

## 2024-08-03 DIAGNOSIS — Z794 Long term (current) use of insulin: Secondary | ICD-10-CM | POA: Diagnosis not present

## 2024-08-03 DIAGNOSIS — E1169 Type 2 diabetes mellitus with other specified complication: Secondary | ICD-10-CM | POA: Diagnosis not present

## 2024-08-04 ENCOUNTER — Ambulatory Visit: Payer: Self-pay | Admitting: Endocrinology

## 2024-08-04 LAB — BASIC METABOLIC PANEL WITH GFR
BUN/Creatinine Ratio: 26 (calc) — ABNORMAL HIGH (ref 6–22)
BUN: 34 mg/dL — ABNORMAL HIGH (ref 7–25)
CO2: 25 mmol/L (ref 20–32)
Calcium: 10.5 mg/dL — ABNORMAL HIGH (ref 8.6–10.4)
Chloride: 107 mmol/L (ref 98–110)
Creat: 1.31 mg/dL — ABNORMAL HIGH (ref 0.50–1.05)
Glucose, Bld: 80 mg/dL (ref 65–99)
Potassium: 4.8 mmol/L (ref 3.5–5.3)
Sodium: 141 mmol/L (ref 135–146)
eGFR: 45 mL/min/1.73m2 — ABNORMAL LOW (ref >=60)

## 2024-08-04 LAB — HEMOGLOBIN A1C
Hgb A1c MFr Bld: 6.8 % — ABNORMAL HIGH (ref <5.7)
Mean Plasma Glucose: 148 mg/dL
eAG (mmol/L): 8.2 mmol/L

## 2024-08-04 LAB — MICROALBUMIN / CREATININE URINE RATIO
Creatinine, Urine: 103 mg/dL (ref 20–275)
Microalb, Ur: <0.2 mg/dL

## 2024-08-06 DIAGNOSIS — E119 Type 2 diabetes mellitus without complications: Secondary | ICD-10-CM | POA: Diagnosis not present

## 2024-08-08 ENCOUNTER — Ambulatory Visit: Admitting: Endocrinology

## 2024-08-08 ENCOUNTER — Encounter: Payer: Self-pay | Admitting: Endocrinology

## 2024-08-08 VITALS — BP 158/100 | Ht 61.0 in | Wt 248.0 lb

## 2024-08-08 DIAGNOSIS — Z794 Long term (current) use of insulin: Secondary | ICD-10-CM | POA: Diagnosis not present

## 2024-08-08 DIAGNOSIS — E1169 Type 2 diabetes mellitus with other specified complication: Secondary | ICD-10-CM
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
## 2024-09-08 NOTE — Progress Notes (Signed)
 GENELLA BAS                                          MRN: [id number]   09/08/2024   The [facility]BCI Quality Team Specialist reviewed this patient medical record for the purposes of chart review for care gap closure. The following were reviewed: abstraction for care gap closure-glycemic status assessment.    VBCI Quality Team

## 2024-09-10 ENCOUNTER — Other Ambulatory Visit: Payer: Self-pay | Admitting: Medical Genetics

## 2024-09-14 DIAGNOSIS — K080 Exfoliation of teeth due to systemic causes: Secondary | ICD-10-CM | POA: Diagnosis not present

## 2024-09-15 DIAGNOSIS — K080 Exfoliation of teeth due to systemic causes: Secondary | ICD-10-CM | POA: Diagnosis not present

## 2024-09-16 ENCOUNTER — Ambulatory Visit: Admitting: Family Medicine

## 2024-09-16 DIAGNOSIS — M5416 Radiculopathy, lumbar region: Secondary | ICD-10-CM | POA: Diagnosis not present

## 2024-10-04 ENCOUNTER — Other Ambulatory Visit: Payer: Self-pay | Admitting: Family Medicine

## 2024-10-04 DIAGNOSIS — I10 Essential (primary) hypertension: Secondary | ICD-10-CM

## 2024-10-12 ENCOUNTER — Other Ambulatory Visit: Payer: Self-pay | Admitting: Endocrinology

## 2024-10-13 ENCOUNTER — Other Ambulatory Visit

## 2024-10-18 ENCOUNTER — Ambulatory Visit (INDEPENDENT_AMBULATORY_CARE_PROVIDER_SITE_OTHER): Admitting: Family Medicine

## 2024-10-18 ENCOUNTER — Encounter: Payer: Self-pay | Admitting: Family Medicine

## 2024-10-18 VITALS — BP 136/78 | HR 57 | Temp 97.7°F | Ht 61.0 in | Wt 250.8 lb

## 2024-10-18 DIAGNOSIS — Z23 Encounter for immunization: Secondary | ICD-10-CM

## 2024-10-18 DIAGNOSIS — Z6841 Body Mass Index (BMI) 40.0 and over, adult: Secondary | ICD-10-CM

## 2024-10-18 DIAGNOSIS — E785 Hyperlipidemia, unspecified: Secondary | ICD-10-CM | POA: Diagnosis not present

## 2024-10-18 DIAGNOSIS — N1831 Chronic kidney disease, stage 3a: Secondary | ICD-10-CM

## 2024-10-18 DIAGNOSIS — Z794 Long term (current) use of insulin: Secondary | ICD-10-CM

## 2024-10-18 DIAGNOSIS — I10 Essential (primary) hypertension: Secondary | ICD-10-CM | POA: Diagnosis not present

## 2024-10-18 DIAGNOSIS — E1122 Type 2 diabetes mellitus with diabetic chronic kidney disease: Secondary | ICD-10-CM

## 2024-10-18 NOTE — Assessment & Plan Note (Signed)
 Continue focus on blood pressure and glucose control, adequate hydration, and avoidance of nephrotoxic medications.

## 2024-10-18 NOTE — Assessment & Plan Note (Signed)
 A1c is now at goal. Continue insulin  glargine (Lantus ) 22 units at bedtime.

## 2024-10-18 NOTE — Assessment & Plan Note (Signed)
 LDl cholesterol is < 100. Continue rosuvastatin  5 mg daily.

## 2024-10-18 NOTE — Assessment & Plan Note (Signed)
 Laura Campbell's BP is acceptable. Continue HCTZ 25 mg daily, spironolactone  25 mg daily, and telmisartan  80 mg daily.

## 2024-10-18 NOTE — Assessment & Plan Note (Signed)
 Laura Campbell weight has increased as she noted since she started on insulin .   Discussed principles of weight management, including a foundation of a healthy, calorie-controlled diet and regular exercise. We did discuss the role of medication to augment dietary and exercise efforts, but she would prefer to focus on diet and exercise for now. She plans to start a Tai Chi class. She is interested in referral to [facility] program.

## 2024-10-18 NOTE — Progress Notes (Signed)
 " [facility] [address] [place] [address] Dept: [phone] Dept Fax: [phone]  Chronic Care Office Visit  Subjective:    Patient ID: Laura Campbell, female    DOB: 11-19-1960, 63 y.o..   MRN: [id number]  Chief Complaint  Patient presents with   Diabetes    4 month f/u DM.  Average BS 98 -119.  Not fasting today.    History of Present Illness:  Patient is in today for reassessment of chronic medical conditions.   Laura Campbell has a history of Type 2 diabetes. She sees Dr. Mercie (endocrinology). She is managed on insulin  glargine (Lantus ) 22 units each morning. Reports fasting glucose is 98-119. She is tolerating this medicine well, whereas she had side effects with multiple oral medications. However, she notes she ahs had weight gain since starting on insulin .   Laura Campbell has a history of hypertension. She is currently on HCTZ 25 mg daily, spironolactone  25 mg daily, and telmisartan  80 mg daily.   Laura Campbell has a history of hyperlipidemia and is managed on rosuvastatin  5 mg daily.  Past Medical History: Patient Active Problem List   Diagnosis Date Noted   Chronic kidney disease, stage 3a (HCC) 07/08/2023   Morbid obesity with BMI of 40.0-44.9, adult (HCC) 04/15/2022   Protein C deficiency 04/15/2022   Protein S deficiency 04/15/2022   History of colon polyps 12/11/2021   Diverticulosis 12/11/2021   Lumbar radiculopathy, chronic 05/07/2021   Spinal stenosis, lumbar region without neurogenic claudication 05/07/2021   Essential hypertension 05/07/2021   History of DVT (deep vein thrombosis) 05/07/2021   History of pulmonary embolus (PE) 05/07/2021   Hyperlipidemia 05/07/2021   Chronic low back pain 05/07/2021   Hereditary and idiopathic peripheral neuropathy 03/27/2015   Left foot pain 03/27/2015   Type 2 diabetes mellitus with stage 3a chronic kidney disease, with long-term current use of insulin  (HCC)  03/27/2015   Past Surgical History:  Procedure Laterality Date   TONSILLECTOMY     TOTAL ABDOMINAL HYSTERECTOMY  10/06/2008   Family History  Problem Relation Age of Onset   Heart disease Mother    Cirrhosis Father    Stroke Sister    Diabetes Sister    Cancer Sister        Breast   Breast cancer Maternal Aunt    Breast cancer Cousin    Neuropathy Neg Hx    Colon cancer Neg Hx    Esophageal cancer Neg Hx    Liver cancer Neg Hx    Pancreatic cancer Neg Hx    Outpatient Medications Prior to Visit  Medication Sig Dispense Refill   apixaban  (ELIQUIS ) 2.5 MG TABS tablet TAKE 1 TABLET BY MOUTH TWICE A DAY 60 tablet 11   EMBECTA PEN NEEDLE NANO 2 GEN 32G X 4 MM MISC USE AS DIRECTED ONCE DAILY 100 each 3   EPINEPHrine  0.3 mg/0.3 mL IJ SOAJ injection Inject 0.3 mg into the muscle as needed for anaphylaxis. 2 each 0   hydrochlorothiazide  (HYDRODIURIL ) 25 MG tablet TAKE 1 TABLET (25 MG TOTAL) BY MOUTH DAILY. 90 tablet 1   insulin  glargine (LANTUS  SOLOSTAR) 100 UNIT/ML Solostar Pen Inject 22 Units into the skin daily. 15 mL 4   meloxicam  (MOBIC ) 15 MG tablet Take 15 mg by mouth daily.     OVER THE COUNTER MEDICATION Liv on Go glucometer     rosuvastatin  (CRESTOR ) 5 MG tablet TAKE 1 TABLET (5 MG TOTAL) BY MOUTH DAILY.  90 tablet 1   spironolactone  (ALDACTONE ) 25 MG tablet TAKE 1 TABLET BY MOUTH EVERY DAY IN THE MORNING 90 tablet 3   telmisartan  (MICARDIS ) 80 MG tablet Take 1 tablet (80 mg total) by mouth daily. 90 tablet 1   No facility-administered medications prior to visit.   Allergies[1]   Objective:   Today's Vitals   10/18/24 0909  BP: 136/78  Pulse: (!) 57  Temp: 97.7 F (36.5 C)  TempSrc: Temporal  SpO2: 98%  Weight: 250 lb 12.8 oz (113.8 kg)  Height: 5' 1 (1.549 m)   Body mass index is 47.39 kg/m.   General: Well developed, well nourished. No acute distress. Psych: Alert and oriented. Normal mood and affect.  Health Maintenance Due  Topic Date Due   Influenza  Vaccine  05/06/2024   Medicare Annual Wellness (AWV)  07/26/2024   Lab Results Lab Results  Component Value Date   HGBA1C 6.8 (H) 08/03/2024   HGBA1C 7.5 (H) 05/17/2024   HGBA1C 7.0 02/12/2024     Lab Results  Component Value Date   CHOL 168 05/17/2024   HDL 53.90 05/17/2024   LDLCALC 99 05/17/2024   TRIG 76.0 05/17/2024   CHOLHDL 3 05/17/2024    Assessment & Plan:   Problem List Items Addressed This Visit       Cardiovascular and Mediastinum   Essential hypertension - Primary   Laura Campbell's BP is acceptable. Continue HCTZ 25 mg daily, spironolactone  25 mg daily, and telmisartan  80 mg daily.         Endocrine   Type 2 diabetes mellitus with stage 3a chronic kidney disease, with long-term current use of insulin  (HCC)   A1c is now at goal. Continue insulin  glargine (Lantus ) 22 units at bedtime.         Genitourinary   Chronic kidney disease, stage 3a (HCC)   Continue focus on blood pressure and glucose control, adequate hydration, and avoidance of nephrotoxic medications.        Other   Hyperlipidemia   LDl cholesterol is < 100. Continue rosuvastatin  5 mg daily.      Morbid obesity with BMI of 45.0-49.9, adult (HCC)   Laura Campbell's weight has increased as she noted since she started on insulin .   Discussed principles of weight management, including a foundation of a healthy, calorie-controlled diet and regular exercise. We did discuss the role of medication to augment dietary and exercise efforts, but she would prefer to focus on diet and exercise for now. She plans to start a Tai Chi class. She is interested in referral to [facility] program.       Relevant Orders   Amb Ref to Medical Weight Management   Other Visit Diagnoses       Need for immunization against influenza       Relevant Orders   Flu vaccine HIGH DOSE PF(Fluzone Trivalent) (Completed)       Return in about 4 months (around 02/15/2025) for Reassessment.   Garnette CHRISTELLA Simpler, MD  I,Emily Lagle,acting as a scribe for Garnette CHRISTELLA Simpler, MD.,have documented all relevant documentation on the behalf of Garnette CHRISTELLA Simpler, MD.  I, Garnette CHRISTELLA Simpler, MD, have reviewed all documentation for this visit. The documentation on 10/18/2024 for the exam, diagnosis, procedures, and orders are all accurate and complete.     [1]  Allergies Allergen Reactions   Januvia  [Sitagliptin ] Anaphylaxis    Pt reported being treated at the ED after taking Januvia  and  experiencing swelling of the throat.   Jardiance [Empagliflozin] Other (See Comments)    Hypotension   Metformin  And Related Other (See Comments)    GI Intolerance   Pioglitazone  Swelling   "

## 2024-11-04 ENCOUNTER — Other Ambulatory Visit: Payer: Self-pay | Admitting: Endocrinology

## 2024-11-04 DIAGNOSIS — E1169 Type 2 diabetes mellitus with other specified complication: Secondary | ICD-10-CM

## 2024-11-21 ENCOUNTER — Institutional Professional Consult (permissible substitution) (INDEPENDENT_AMBULATORY_CARE_PROVIDER_SITE_OTHER): Admitting: Family Medicine

## 2024-12-13 ENCOUNTER — Ambulatory Visit

## 2024-12-20 ENCOUNTER — Ambulatory Visit

## 2025-01-05 ENCOUNTER — Other Ambulatory Visit

## 2025-01-09 ENCOUNTER — Ambulatory Visit: Admitting: Endocrinology

## 2025-01-11 ENCOUNTER — Ambulatory Visit: Admitting: Endocrinology

## 2025-02-15 ENCOUNTER — Ambulatory Visit: Admitting: Family Medicine
